# Patient Record
Sex: Female | Born: 1970
Health system: Southern US, Community
[De-identification: ages and names within clinical notes are randomized; demographics above are authoritative.]

## PROBLEM LIST (undated history)

## (undated) DIAGNOSIS — D649 Anemia, unspecified: Secondary | ICD-10-CM

## (undated) DIAGNOSIS — I890 Lymphedema, not elsewhere classified: Secondary | ICD-10-CM

## (undated) DIAGNOSIS — N200 Calculus of kidney: Secondary | ICD-10-CM

## (undated) DIAGNOSIS — N83201 Unspecified ovarian cyst, right side: Secondary | ICD-10-CM

## (undated) DIAGNOSIS — G43909 Migraine, unspecified, not intractable, without status migrainosus: Secondary | ICD-10-CM

## (undated) DIAGNOSIS — N83202 Unspecified ovarian cyst, left side: Secondary | ICD-10-CM

## (undated) HISTORY — PX: OTHER SURGICAL HISTORY: SHX169

## (undated) HISTORY — PX: TUBAL LIGATION: SHX77

---

## 1997-02-08 ENCOUNTER — Inpatient Hospital Stay (HOSPITAL_COMMUNITY): Admission: AD | Admit: 1997-02-08 | Discharge: 1997-02-08 | Payer: Self-pay | Admitting: *Deleted

## 1997-02-12 ENCOUNTER — Inpatient Hospital Stay (HOSPITAL_COMMUNITY): Admission: AD | Admit: 1997-02-12 | Discharge: 1997-02-15 | Payer: Self-pay | Admitting: *Deleted

## 1997-09-11 ENCOUNTER — Emergency Department (HOSPITAL_COMMUNITY): Admission: EM | Admit: 1997-09-11 | Discharge: 1997-09-11 | Payer: Self-pay | Admitting: Emergency Medicine

## 1997-11-15 ENCOUNTER — Ambulatory Visit (HOSPITAL_COMMUNITY): Admission: RE | Admit: 1997-11-15 | Discharge: 1997-11-15 | Payer: Self-pay | Admitting: *Deleted

## 1997-12-26 ENCOUNTER — Ambulatory Visit (HOSPITAL_COMMUNITY): Admission: RE | Admit: 1997-12-26 | Discharge: 1997-12-26 | Payer: Self-pay | Admitting: *Deleted

## 1998-01-10 ENCOUNTER — Inpatient Hospital Stay (HOSPITAL_COMMUNITY): Admission: AD | Admit: 1998-01-10 | Discharge: 1998-01-10 | Payer: Self-pay | Admitting: *Deleted

## 1998-01-29 ENCOUNTER — Ambulatory Visit (HOSPITAL_COMMUNITY): Admission: RE | Admit: 1998-01-29 | Discharge: 1998-01-29 | Payer: Self-pay | Admitting: *Deleted

## 1998-02-27 ENCOUNTER — Inpatient Hospital Stay (HOSPITAL_COMMUNITY): Admission: AD | Admit: 1998-02-27 | Discharge: 1998-02-27 | Payer: Self-pay | Admitting: *Deleted

## 1998-05-22 ENCOUNTER — Ambulatory Visit (HOSPITAL_COMMUNITY): Admission: RE | Admit: 1998-05-22 | Discharge: 1998-05-22 | Payer: Self-pay | Admitting: *Deleted

## 1998-06-16 ENCOUNTER — Emergency Department (HOSPITAL_COMMUNITY): Admission: EM | Admit: 1998-06-16 | Discharge: 1998-06-16 | Payer: Self-pay | Admitting: Emergency Medicine

## 1998-09-29 ENCOUNTER — Emergency Department (HOSPITAL_COMMUNITY): Admission: EM | Admit: 1998-09-29 | Discharge: 1998-09-29 | Payer: Self-pay | Admitting: Emergency Medicine

## 1998-10-01 ENCOUNTER — Emergency Department (HOSPITAL_COMMUNITY): Admission: EM | Admit: 1998-10-01 | Discharge: 1998-10-01 | Payer: Self-pay | Admitting: Emergency Medicine

## 1998-10-01 ENCOUNTER — Encounter: Payer: Self-pay | Admitting: Emergency Medicine

## 1999-01-13 ENCOUNTER — Emergency Department (HOSPITAL_COMMUNITY): Admission: EM | Admit: 1999-01-13 | Discharge: 1999-01-13 | Payer: Self-pay | Admitting: Emergency Medicine

## 1999-06-12 ENCOUNTER — Emergency Department (HOSPITAL_COMMUNITY): Admission: EM | Admit: 1999-06-12 | Discharge: 1999-06-12 | Payer: Self-pay | Admitting: Emergency Medicine

## 1999-08-08 ENCOUNTER — Emergency Department (HOSPITAL_COMMUNITY): Admission: EM | Admit: 1999-08-08 | Discharge: 1999-08-08 | Payer: Self-pay | Admitting: Emergency Medicine

## 2000-06-27 ENCOUNTER — Encounter: Admission: RE | Admit: 2000-06-27 | Discharge: 2000-06-27 | Payer: Self-pay | Admitting: Family Medicine

## 2000-06-27 ENCOUNTER — Encounter: Payer: Self-pay | Admitting: Family Medicine

## 2000-08-15 ENCOUNTER — Emergency Department (HOSPITAL_COMMUNITY): Admission: EM | Admit: 2000-08-15 | Discharge: 2000-08-16 | Payer: Self-pay | Admitting: Emergency Medicine

## 2001-02-09 ENCOUNTER — Emergency Department (HOSPITAL_COMMUNITY): Admission: EM | Admit: 2001-02-09 | Discharge: 2001-02-09 | Payer: Self-pay | Admitting: Emergency Medicine

## 2001-02-11 ENCOUNTER — Encounter: Payer: Self-pay | Admitting: Emergency Medicine

## 2001-02-11 ENCOUNTER — Emergency Department (HOSPITAL_COMMUNITY): Admission: EM | Admit: 2001-02-11 | Discharge: 2001-02-11 | Payer: Self-pay

## 2003-03-13 ENCOUNTER — Observation Stay (HOSPITAL_COMMUNITY): Admission: EM | Admit: 2003-03-13 | Discharge: 2003-03-14 | Payer: Self-pay | Admitting: Emergency Medicine

## 2004-10-13 ENCOUNTER — Inpatient Hospital Stay (HOSPITAL_COMMUNITY): Admission: AD | Admit: 2004-10-13 | Discharge: 2004-10-13 | Payer: Self-pay | Admitting: *Deleted

## 2004-10-19 ENCOUNTER — Other Ambulatory Visit: Admission: RE | Admit: 2004-10-19 | Discharge: 2004-10-19 | Payer: Self-pay | Admitting: Obstetrics and Gynecology

## 2004-10-23 ENCOUNTER — Ambulatory Visit (HOSPITAL_COMMUNITY): Admission: RE | Admit: 2004-10-23 | Discharge: 2004-10-23 | Payer: Self-pay | Admitting: Obstetrics and Gynecology

## 2004-11-04 ENCOUNTER — Ambulatory Visit (HOSPITAL_COMMUNITY): Admission: RE | Admit: 2004-11-04 | Discharge: 2004-11-04 | Payer: Self-pay | Admitting: Obstetrics and Gynecology

## 2004-11-04 ENCOUNTER — Encounter (INDEPENDENT_AMBULATORY_CARE_PROVIDER_SITE_OTHER): Payer: Self-pay | Admitting: Specialist

## 2006-08-02 ENCOUNTER — Emergency Department (HOSPITAL_COMMUNITY): Admission: EM | Admit: 2006-08-02 | Discharge: 2006-08-02 | Payer: Self-pay | Admitting: Emergency Medicine

## 2007-09-13 ENCOUNTER — Emergency Department (HOSPITAL_COMMUNITY): Admission: EM | Admit: 2007-09-13 | Discharge: 2007-09-13 | Payer: Self-pay | Admitting: Emergency Medicine

## 2007-11-14 ENCOUNTER — Emergency Department (HOSPITAL_COMMUNITY): Admission: EM | Admit: 2007-11-14 | Discharge: 2007-11-14 | Payer: Self-pay | Admitting: Emergency Medicine

## 2007-11-17 ENCOUNTER — Emergency Department (HOSPITAL_COMMUNITY): Admission: EM | Admit: 2007-11-17 | Discharge: 2007-11-17 | Payer: Self-pay | Admitting: Emergency Medicine

## 2007-11-27 ENCOUNTER — Ambulatory Visit: Payer: Self-pay | Admitting: Internal Medicine

## 2007-12-14 ENCOUNTER — Emergency Department (HOSPITAL_COMMUNITY): Admission: EM | Admit: 2007-12-14 | Discharge: 2007-12-14 | Payer: Self-pay | Admitting: Emergency Medicine

## 2008-01-03 ENCOUNTER — Emergency Department (HOSPITAL_COMMUNITY): Admission: EM | Admit: 2008-01-03 | Discharge: 2008-01-03 | Payer: Self-pay | Admitting: Family Medicine

## 2008-01-10 ENCOUNTER — Emergency Department (HOSPITAL_COMMUNITY): Admission: EM | Admit: 2008-01-10 | Discharge: 2008-01-10 | Payer: Self-pay | Admitting: Emergency Medicine

## 2008-02-05 ENCOUNTER — Emergency Department (HOSPITAL_COMMUNITY): Admission: EM | Admit: 2008-02-05 | Discharge: 2008-02-05 | Payer: Self-pay | Admitting: Emergency Medicine

## 2008-03-21 ENCOUNTER — Emergency Department (HOSPITAL_COMMUNITY): Admission: EM | Admit: 2008-03-21 | Discharge: 2008-03-21 | Payer: Self-pay | Admitting: Family Medicine

## 2008-05-02 ENCOUNTER — Emergency Department (HOSPITAL_COMMUNITY): Admission: EM | Admit: 2008-05-02 | Discharge: 2008-05-02 | Payer: Self-pay | Admitting: Emergency Medicine

## 2008-05-22 ENCOUNTER — Encounter: Admission: RE | Admit: 2008-05-22 | Discharge: 2008-05-22 | Payer: Self-pay | Admitting: Specialist

## 2008-06-26 ENCOUNTER — Ambulatory Visit: Payer: Self-pay | Admitting: Oncology

## 2008-07-31 ENCOUNTER — Emergency Department (HOSPITAL_COMMUNITY): Admission: EM | Admit: 2008-07-31 | Discharge: 2008-07-31 | Payer: Self-pay | Admitting: Emergency Medicine

## 2008-08-14 ENCOUNTER — Emergency Department (HOSPITAL_COMMUNITY): Admission: EM | Admit: 2008-08-14 | Discharge: 2008-08-14 | Payer: Self-pay | Admitting: Family Medicine

## 2008-09-15 ENCOUNTER — Emergency Department (HOSPITAL_COMMUNITY): Admission: EM | Admit: 2008-09-15 | Discharge: 2008-09-15 | Payer: Self-pay | Admitting: Emergency Medicine

## 2008-11-18 ENCOUNTER — Emergency Department (HOSPITAL_COMMUNITY): Admission: EM | Admit: 2008-11-18 | Discharge: 2008-11-18 | Payer: Self-pay | Admitting: Emergency Medicine

## 2008-12-06 ENCOUNTER — Emergency Department (HOSPITAL_COMMUNITY): Admission: EM | Admit: 2008-12-06 | Discharge: 2008-12-06 | Payer: Self-pay | Admitting: Emergency Medicine

## 2009-01-09 ENCOUNTER — Emergency Department (HOSPITAL_COMMUNITY): Admission: EM | Admit: 2009-01-09 | Discharge: 2009-01-09 | Payer: Self-pay | Admitting: Emergency Medicine

## 2009-02-05 ENCOUNTER — Emergency Department (HOSPITAL_COMMUNITY): Admission: EM | Admit: 2009-02-05 | Discharge: 2009-02-05 | Payer: Self-pay | Admitting: Emergency Medicine

## 2009-04-06 ENCOUNTER — Emergency Department (HOSPITAL_COMMUNITY): Admission: EM | Admit: 2009-04-06 | Discharge: 2009-04-06 | Payer: Self-pay | Admitting: Family Medicine

## 2009-04-11 ENCOUNTER — Emergency Department (HOSPITAL_COMMUNITY): Admission: EM | Admit: 2009-04-11 | Discharge: 2009-04-11 | Payer: Self-pay | Admitting: Family Medicine

## 2009-04-26 ENCOUNTER — Emergency Department (HOSPITAL_COMMUNITY): Admission: EM | Admit: 2009-04-26 | Discharge: 2009-04-26 | Payer: Self-pay | Admitting: Family Medicine

## 2009-07-17 ENCOUNTER — Emergency Department (HOSPITAL_COMMUNITY): Admission: EM | Admit: 2009-07-17 | Discharge: 2009-07-17 | Payer: Self-pay | Admitting: Family Medicine

## 2009-09-07 ENCOUNTER — Emergency Department (HOSPITAL_COMMUNITY): Admission: EM | Admit: 2009-09-07 | Discharge: 2009-09-07 | Payer: Self-pay | Admitting: Emergency Medicine

## 2010-03-22 LAB — POCT I-STAT, CHEM 8
BUN: 8 mg/dL (ref 6–23)
Calcium, Ion: 1.16 mmol/L (ref 1.12–1.32)
Chloride: 105 mEq/L (ref 96–112)
Creatinine, Ser: 0.7 mg/dL (ref 0.4–1.2)
Glucose, Bld: 92 mg/dL (ref 70–99)
HCT: 32 % — ABNORMAL LOW (ref 36.0–46.0)
Hemoglobin: 10.9 g/dL — ABNORMAL LOW (ref 12.0–15.0)
Potassium: 3.7 mEq/L (ref 3.5–5.1)
Sodium: 139 mEq/L (ref 135–145)
TCO2: 24 mmol/L (ref 0–100)

## 2010-03-22 LAB — POCT PREGNANCY, URINE: Preg Test, Ur: NEGATIVE

## 2010-04-07 ENCOUNTER — Encounter (HOSPITAL_COMMUNITY)
Admission: RE | Admit: 2010-04-07 | Discharge: 2010-04-07 | Disposition: A | Discharge status: Home / Self Care | Payer: Managed Care, Other (non HMO) | Source: Ambulatory Visit | Attending: Obstetrics | Admitting: Obstetrics

## 2010-04-07 LAB — CBC
HCT: 37 % (ref 36.0–46.0)
Hemoglobin: 11 g/dL — ABNORMAL LOW (ref 12.0–15.0)
MCH: 22 pg — ABNORMAL LOW (ref 26.0–34.0)
MCHC: 29.7 g/dL — ABNORMAL LOW (ref 30.0–36.0)
MCV: 73.9 fL — ABNORMAL LOW (ref 78.0–100.0)
RBC: 5.01 MIL/uL (ref 3.87–5.11)
WBC: 3.3 10*3/uL — ABNORMAL LOW (ref 4.0–10.5)

## 2010-04-14 ENCOUNTER — Other Ambulatory Visit: Payer: Self-pay | Admitting: Obstetrics

## 2010-04-14 ENCOUNTER — Ambulatory Visit (HOSPITAL_COMMUNITY)
Admission: RE | Admit: 2010-04-14 | Discharge: 2010-04-14 | Disposition: A | Discharge status: Home / Self Care | Payer: Managed Care, Other (non HMO) | Source: Ambulatory Visit | Attending: Obstetrics | Admitting: Obstetrics

## 2010-04-14 DIAGNOSIS — N949 Unspecified condition associated with female genital organs and menstrual cycle: Secondary | ICD-10-CM | POA: Insufficient documentation

## 2010-04-14 DIAGNOSIS — N938 Other specified abnormal uterine and vaginal bleeding: Secondary | ICD-10-CM | POA: Insufficient documentation

## 2010-04-14 DIAGNOSIS — Z01818 Encounter for other preprocedural examination: Secondary | ICD-10-CM | POA: Insufficient documentation

## 2010-04-14 DIAGNOSIS — Z01812 Encounter for preprocedural laboratory examination: Secondary | ICD-10-CM | POA: Insufficient documentation

## 2010-04-14 LAB — PREGNANCY, URINE: Preg Test, Ur: NEGATIVE

## 2010-04-15 NOTE — Op Note (Signed)
  NAMETONIYA, ROZAR NO.:  [id number]  MEDICAL RECORD NO.:  [id number]           PATIENT TYPE:  O  LOCATION:  [facility]                          [facility]  PHYSICIAN:  Kathreen Cosier, M.D.DATE OF BIRTH:  April 03, 1970  DATE OF PROCEDURE:  04/14/2010 DATE OF DISCHARGE:                              OPERATIVE REPORT   PREOPERATIVE DIAGNOSIS:  Dysfunctional uterine bleeding.  POSTOPERATIVE DIAGNOSIS:  Dysfunctional uterine bleeding.  PROCEDURE:  Hysteroscopy dilation and curettage, and NovaSure ablation.  PROCEDURE:  Under general anesthesia, the patient in lithotomy position, perineum and vagina prepped and draped.  Bladder emptied with straight catheter.  Bimanual exam revealed uterus to be normal size.  Speculum placed in the vagina.  Cervix injected with 10 mL of 1% Xylocaine.  The endometrial cavity sounded 10 cm.  Cervix was measured at 5 cm. Endocervix curetted small amount of tissue.  Cervix dilated #25 Shawnie Pons. The hysteroscope inserted and was noted she had some polyps. Hysteroscope removed.  Sharp curettage performed.  NovaSure device inserted and the cavity width was 4.5 cm.  The cavity integrity test was passed and ablation was performed at 124 watts for 1 minute and 20 seconds post ablation.  Hysteroscopy repeated, total ablation of the cavity.  Fluid deficit was 130 mL.  The patient tolerated the procedure well, taken to recovery room in good condition.          ______________________________ Kathreen Cosier, M.D.     BAM/MEDQ  D:  04/14/2010  T:  04/14/2010  Job:  [id number]  Electronically Signed by Francoise Ceo M.D. on 04/15/2010 08:23:50 AM

## 2010-05-22 NOTE — H&P (Signed)
NAMEDEANE, [name]              ACCOUNT NO.:  [id number]   MEDICAL RECORD NO.:  [id number]          PATIENT TYPE:  AMB   LOCATION:  [facility]                           [facility]   PHYSICIAN:  James A. Ashley Royalty, M.D.DATE OF BIRTH:  01/06/1970   DATE OF ADMISSION:  DATE OF DISCHARGE:                                HISTORY & PHYSICAL   This is a 40 year old gravida 2 para 2.   CHIEF COMPLAINT:  Left-sided abdominal pain and abnormal bleeding.   The patient presented to me October 19, 2004, complaining of left lower  quadrant discomfort of some 2 months duration. She also complained of excess  vaginal bleeding on a daily basis for 2 months. The amount appeared to be  something in between spotting and a normal period. She stated the pain is  rather debilitating for her. She went to [facility] October 12, 2004.  An ultrasound was performed which was normal by her history. She had had  no pelvic exam or Pap smear in some 6 years. CBC was obtained and the  hemoglobin was noted to be 7.5 with diminished indices. In addition, the  serum iron was less than 10. At the time of that visit, a polyp was noted  extending from the patient's cervix. Its origin could not be readily  determined. In addition, the uterus was noted to be upper limits of normal  size with a left fundal protuberance. The patient was also noted to have a  mass at the left aspect of her C-section incision. It was approximately 2 cm  in diameter. It was not more prominent with standing and Valsalva. I called  [facility] radiology and talked with Dr. Kearney Hard regarding the optimal  imaging of this area. She suggested that we do an abdominal and pelvic CT  scan in order to rule out the possibility of an incisional hernia. The  patient went on to have the CT scan which was initially read out by Dr.  Eppie Gibson. The impression was normal appearance of both uterus and ovaries.  There was no evidence of any pelvic mass. There  was no mention of any  hernia. However, under clinical data there was no mention of the mass in the  left aspect of the patient's C-section incision as I had told Dr. Kearney Hard.  Hence, I called radiology and talked to Dr. Molli Posey. He examined the films  carefully and told me that he felt the mass in the left aspect of her C-  section incision represented scar tissue only and was not of any other  concern.   As this patient is currently significantly anemic and has an ongoing  bleeding diathesis which is felt secondary to the structural abnormality  within her uterus, she is for [facility] hysteroscopy in order to stem the  structural abnormality and bleeding diathesis in preparation for a possible  subsequent laparoscopy in a different operative setting.   MEDICATIONS:  Naprosyn.   PAST MEDICAL HISTORY:  Medical:  C-sections x2, BTSP.   ALLERGIES:  None.   FAMILY HISTORY:  Positive for hypertension and diabetes.  SOCIAL HISTORY:  The patient denies use of tobacco or significant alcohol.   REVIEW OF SYSTEMS:  Noncontributory. The patient does admit to having a  blood transfusion in 2004.   PHYSICAL EXAMINATION:  GENERAL:  Well-developed, well-nourished, pleasant  black female in no acute distress.  VITAL SIGNS:  Afebrile, vital signs stable.  SKIN:  Warm and dry without lesions.  CHEST:  Lungs are clear.  CARDIAC:  Regular rate and rhythm without murmurs, gallops, or rubs.  BREAST:  Deferred.  ABDOMEN:  Soft and nontender. There is the 2 cm protuberance in the left  aspect of her Pfannenstiel incision which is mentioned above. Bowel sounds  are active.  MUSCULOSKELETAL:  No CVA tenderness.  PELVIC:  External genitalia within normal limits. Vagina is without gross  lesions. The cervical area reveals a polyp as previously mentioned. The  uterus is upper limits of normal size with a left fundal protuberance.   IMPRESSION:  1.  Cervical versus endometrial polyp.  2.  Abnormal uterine  bleeding - probably secondary to #1.  3.  Anemia - probably secondary to #2.  4.  History of noncompliance with gynecological care.  5.  Status post cesarean section x2.  6.  Status post bilateral tubal sterilization procedure.  7.  Left lower quadrant superficial mass near left aspect of Pfannenstiel      incision - felt to represent scar tissue per radiology.  8.  Left lower quadrant discomfort - etiology uncertain. Differential      includes endometriosis, adhesions, primary adnexal pathology, GI,      musculoskeletal.   PLAN:  Diagnostic/operative hysteroscopy with dilatation and curettage.  Risks, benefits, complications, and alternatives fully discussed with the  patient. The possible need for blood transfusion discussed and accepted.  Risks and benefits of same discussed. The patient states she understands and  accepts and is comfortable with this plan.      James A. Ashley Royalty, M.D.  Electronically Signed     JAM/MEDQ  D:  11/04/2004  T:  11/04/2004  Job:  [id number]

## 2010-05-22 NOTE — H&P (Signed)
NAME:  [name], [name]                        ACCOUNT NO.:  [id number]   MEDICAL RECORD NO.:  [id number]                   PATIENT TYPE:  EMS   LOCATION:  [facility]                                 FACILITY:  [facility]   PHYSICIAN:  Hollice Espy, M.D.            DATE OF BIRTH:  Feb 15, 1970   DATE OF ADMISSION:  03/13/2003  DATE OF DISCHARGE:                                HISTORY & PHYSICAL   PRIMARY CARE DOCTOR:  Dr. Consuella Lose C. Griffin.   OBSTETRICIAN/GYNECOLOGIST:  Dr. Kathreen Cosier.   CHIEF COMPLAINT:  Weakness and URI symptoms.   HISTORY OF PRESENT ILLNESS:  This is a 40 year old African American female  with past medical history of iron deficiency anemia which is secondary to  heavy menses, who has had, for the last 3 days, complaints of URI symptoms  including nonproductive cough, congestion, fatigue, general body aches and  some mild shortness of breath.  The patient states that for the last couple  of days she has ultimately become so weak that she can barely move.  She  states that a few days ago she had a GYN procedure to remove fibroids out of  her uterus.  Postop, she was not on any antibiotics.  She continued to feel  weak and she came into the ER today.  She was found to have a temperature of  100.6; in addition, her blood pressure was slightly decreased at 84/43, but  she was not tachycardic.  Most significant was that her hemoglobin and  hematocrit were decreased at 6.6 and 23.1.  The patient reports no history  of vomiting blood or blood in her stools.  She does note that she has had  some dark stools but this is not new; she has been having dark stools ever  since she has been on iron and these did not change in nature.  She denies  any abdominal pain or chest pain.  She only complained again of some  weakness and URI symptoms.  The patient, however, denies any visual changes  or dysphagia.  She denies any nausea or vomiting.  She denies any wheezing.  She denies  any chest pain or palpitations.  She denies any constipation,  diarrhea, hematuria or dysuria.   PAST MEDICAL HISTORY:  1. Past medical history is for hospitalization for renal infection.  2. She has also a history of kidney stones.  3. She has a history of heavy menses.  4. History of iron deficiency anemia.  5. She also has a history of uterine fibroids, status post a removal 2 days     ago.   MEDICATIONS:  The patient is on iron 325 mg p.o. daily, that is all.   ALLERGIES:  She is allergic to MORPHINE.   SOCIAL HISTORY:  She denies any tobacco, drugs or alcohol use.   FAMILY HISTORY:  Her mother also has iron deficiency anemia.   PHYSICAL EXAMINATION:  VITALS:  On admission, temperature 100.6, blood  pressure 84/43, pulse 88, respirations 20; currently, her blood pressure is  104/59, pulse 87, O2 saturation is 98% on 2 L.  GENERAL:  In general, she is in no apparent distress, alert and oriented x3,  although she does appear fatigued.  HEENT/NECK:  Normocephalic, atraumatic.  She has no carotid bruits.  Mucous  membranes are dry.  HEART:  Regular rate and rhythm.  She has what sounds to be a 2/6 innocent  flow murmur.  LUNGS:  Lungs are clear to auscultation bilaterally.  ABDOMEN:  Abdomen is soft, nontender and nondistended.  Positive bowel  sounds.  EXTREMITIES:  No clubbing, cyanosis, or edema.  She has 1+ pulses.   LABORATORY AND ACCESSORY CLINICAL DATA:  Chest x-ray shows evidence of  bronchitis.   Sodium 138, potassium 3.4, chloride 102, bicarb 22, BUN 7, creatinine 0.8,  glucose 86.  White count is 3.4, hemoglobin and hematocrit 6.6 and 23.1 with  an MCV of 54, platelet count of 194,000.  The patient has a lymphocytic  predominance of 59%.  She is also noted to have schistocytes present as well  as tear drop cells on a peripheral smear.   ASSESSMENT AND PLAN:  This is a 40 year old [place] American female who  presents with a significant anemia.  She has a history  of iron deficiency  anemia, likely secondary to heavy menses, although usually not this low.  I  feel that her previous history plus the recent uterine fibroid removal 2  days ago led to her current state.  We will transfuse 2 units, check a  hemoglobin and hematocrit 1 hour after and again tomorrow morning.  If there  is a significant drop, we will consider an obstetrical/gynecological consult  to rule out any type of vaginal bleeding.  The patient tells me that her  last menstrual period ended a week ago and she has not had any significant  vaginal bleeding since.  Although, I would also like to note that the  patient has a family history of iron deficiency anemia and noted  schistocytes and tear drop cells on her peripheral smear.  This may be more  than secondary to heavy menses; perhaps sickle cell should be evaluated in  this patient, but will defer this to her primary care doctor, unless her  hemoglobin and hematocrit continue to drop.  1. Bronchitis:  The patient gives a upper respiratory infection history with     symptoms x3 days.  Her chest x-ray is consistent with bronchitis.  She     has a slight increase in temperature but no significant increase in her     white count or shift.  I have no reason to suspect that this is a     gynecological-related infection postoperatively.  For now, we will just     treat with albuterol.  Her respiratory symptoms should improve with     blood.                                                Hollice Espy, M.D.    SKK/MEDQ  D:  03/13/2003  T:  03/14/2003  Job:  [id number]   cc:   Gretta Arab. Valentina Lucks, M.D.  [address] [place]  [place] [address]  Fax: [phone]

## 2010-05-22 NOTE — Op Note (Signed)
[name], [name]              ACCOUNT NO.:  [id number]   MEDICAL RECORD NO.:  [id number]          PATIENT TYPE:  AMB   LOCATION:  [facility]                           [facility]   PHYSICIAN:  James A. Ashley Royalty, M.D.DATE OF BIRTH:  February 16, 1970   DATE OF PROCEDURE:  11/04/2004  DATE OF DISCHARGE:                                 OPERATIVE REPORT   PREOPERATIVE DIAGNOSES:  1.  Endometrial versus endocervical polyp  2.  Abnormal uterine bleeding, problem secondary to #1.  3.  Anemia, probably secondary to #2.   POSTOPERATIVE DIAGNOSIS:  Endometrial polyps (two).   PROCEDURES:  1.  Diagnostic/operative hysteroscopy.  2.  Dilatation and curettage.   SURGEON:  Rudy Jew. Ashley Royalty, M.D.   ANESTHESIA:  General.   ESTIMATED BLOOD LOSS:  25 mL.   COMPLICATIONS:  None.   PACKS AND DRAINS:  None.   PROCEDURE:  The patient was taken to operating room and placed in the dorsal  supine position.  After general anesthesia was administered, she was placed  in the lithotomy position and prepped and draped in the usual manner for  vaginal surgery.  A posterior weighted retractor was placed per vagina and  the anterior lip of the cervix was grasped with a single-tooth tenacula.  The uterus was gently sounded to 8 cm and noted to be anteverted.  The  cervix was already dilated, and the polyp could be seen emanating from the  cervical os.  The dilatation was verified by greater than or equal to 29-  [place] using [employer] dilators.  The resectoscope was then placed into the uterine cavity using sorbitol as a  distension medium.  The entire endometrial cavity was then inspected.  The  endometrium appeared rather atrophic and the tubal ostia were easily  visualized and appropriately photographed.  The anterior and posterior  aspects of the uterine cavity were unremarkable.  As the hysteroscope was  withdrawn, the origin of the large cervical polyp was noted to be just at  the superior margin of the  cervix.  Using the resectoscope at 60 watts  power, cutting waveform, the polyp was removed.  As the hysteroscope was  further withdrawn, a smaller posterior cervical polyp was noted emanating  from the midportion of the cervix.  This was similarly excised and submitted  separately to pathology for histologic studies.  Hemostasis was obtained  with the coagulation waveform at 50 watts power.  Appropriate photos were  obtained before and after the procedure.  Hemostasis was noted.   Attention was then turned to the uterine curettage.  A medium-size curette  was introduced into the uterine cavity.  First a four-quadrant technique was  employed.  Then a therapeutic technique was employed.  All curettings were  submitted separately to pathology for histologic studies.  After the  therapeutic portion of procedure was performed, the curettage was  terminated.  Hemostasis was noted.  The tenaculum was removed and hemostasis was again noted.  The vaginal  instruments were removed and the procedure terminated.   The patient tolerated the procedure extremely well and was returned to the  recovery room in good condition.      James A. Ashley Royalty, M.D.  Electronically Signed     JAM/MEDQ  D:  11/04/2004  T:  11/04/2004  Job:  [id number]

## 2010-05-22 NOTE — Discharge Summary (Signed)
NAME:  FIORELA, PELZER                        ACCOUNT NO.:  [id number]   MEDICAL RECORD NO.:  [id number]                   PATIENT TYPE:  INP   LOCATION:  3741                                 FACILITY:  [facility]   PHYSICIAN:  Melissa L. Ladona Ridgel, MD               DATE OF BIRTH:  November 05, 1970   DATE OF ADMISSION:  03/13/2003  DATE OF DISCHARGE:  03/14/2003                                 DISCHARGE SUMMARY   ADMITTING DIAGNOSES:  1. Weakness.  2. Upper respiratory infection symptoms.   DISCHARGE DIAGNOSES:  1. Anemia.  2. Upper respiratory infection symptoms.   DISCHARGE MEDICATIONS:  1. Augmentin 500/125 mg p.o. b.i.d. x 7 days.  2. Symptomatic treatment with over the counter medications for cough and     congestion.  Recommendations are Guaifenesin and a decongestant.   PAIN MANAGEMENT:  With Tylenol as directed.   SPECIAL INSTRUCTIONS:  Drink as much as possible to keep yourself well  hydrated.   FOLLOW UP:  1. Followup with your OB/GYN, Dr. Kathreen Cosier, as scheduled.  2. Followup with primary care physician for further or worsening cold     symptoms.   HISTORY OF PRESENT ILLNESS:  The patient is a 40 year old African American  female presents with an iron deficiency anemia likely secondary to heavy  menses and recent fibroid tumor removal.  Over the past several days, she  had a temperature of 100.6, and was found to have a slightly decreased blood  pressure in the emergency room.  Her hemoglobin and hematocrit were found to  be 6.6 and 23.1 respectively, consistent with her recent surgery and  possible heavy menses.  The patient was admitted to the telemetry floor.  She was transfused with 2 units of packed red blood cells with good results,  bringing her hemoglobin and hematocrit up to 9 and 29.9 directly after and  8.6. and 28.3 at discharge.  She felt understandably weak on the morning of  discharge but definitely improved in her symptomatology.  Her blood  pressure  was stable during the course of the hospitalization.  On the morning of  discharge her temperature was 98.2 with a blood pressure of 102/56, pulse of  54,of and respirations 20.  She was sating 97% on room air.  She remained  with some congestion and cough but generally appeared well.   PHYSICAL EXAMINATION:  GENERAL:  Her physical exam was consistent with being  in generally in acute distress.  HEENT:  Pupils equal, round and reactive to light.  Extraocular muscles were  intact.  Mucous membranes were moist.  NECK:  Supple.  There was no JVD.  No lymph nodes.  CHEST:  Scattered rhonchi that cleared with cough.  ABDOMEN:  Soft, nontender, nondistended with positive bowel sounds.  EXTREMITIES:  There was no edema on her extremities.   Her URI symptoms remained pretty much at baseline with symptomatic treatment  during  the hospital course.  She was deemed stable for discharge for further  outpatient care of her upper respiratory infection.  Because of the  increased amount of nasal drainage and tenderness over her sinuses, she was  started on Augmentin 500/125 p.o. b.i.d. x 7 days.  Otherwise instructed to  followup with her primary care physician for further prolonged or worsening  symptoms.  She was found to be stable for discharge to followup with her  OB/GYN regarding her vaginal bleeding and anemia.  She expressed  understanding of the instructions and was desirous of returning home to  recuperate from her upper respiratory infection.   Please note:  That the influenza A and B antigen were tested on admission,  both were negative.                                                Melissa L. Ladona Ridgel, MD    MLT/MEDQ  D:  03/15/2003  T:  03/17/2003  Job:  [id number]   cc:   Gretta Arab. Valentina Lucks, M.D.  [address] [place]  [place] [address]  Fax: [phone]   Kathreen Cosier, M.D.  [address]  [place]  [place] [address]  Fax: [phone]

## 2010-06-01 ENCOUNTER — Emergency Department (HOSPITAL_COMMUNITY)
Admission: EM | Admit: 2010-06-01 | Discharge: 2010-06-01 | Disposition: A | Discharge status: Home / Self Care | Payer: Managed Care, Other (non HMO) | Attending: Emergency Medicine | Admitting: Emergency Medicine

## 2010-06-01 DIAGNOSIS — R509 Fever, unspecified: Secondary | ICD-10-CM | POA: Insufficient documentation

## 2010-06-01 DIAGNOSIS — J329 Chronic sinusitis, unspecified: Secondary | ICD-10-CM | POA: Insufficient documentation

## 2010-06-01 DIAGNOSIS — R112 Nausea with vomiting, unspecified: Secondary | ICD-10-CM | POA: Insufficient documentation

## 2010-06-01 DIAGNOSIS — F29 Unspecified psychosis not due to a substance or known physiological condition: Secondary | ICD-10-CM | POA: Insufficient documentation

## 2010-06-01 DIAGNOSIS — R51 Headache: Secondary | ICD-10-CM | POA: Insufficient documentation

## 2010-06-03 ENCOUNTER — Inpatient Hospital Stay (INDEPENDENT_AMBULATORY_CARE_PROVIDER_SITE_OTHER)
Admission: RE | Admit: 2010-06-03 | Discharge: 2010-06-03 | Disposition: A | Discharge status: Home / Self Care | Payer: Managed Care, Other (non HMO) | Source: Ambulatory Visit | Attending: Family Medicine | Admitting: Family Medicine

## 2010-06-03 DIAGNOSIS — L0201 Cutaneous abscess of face: Secondary | ICD-10-CM

## 2010-06-03 DIAGNOSIS — J019 Acute sinusitis, unspecified: Secondary | ICD-10-CM

## 2010-09-22 ENCOUNTER — Inpatient Hospital Stay (INDEPENDENT_AMBULATORY_CARE_PROVIDER_SITE_OTHER)
Admission: RE | Admit: 2010-09-22 | Discharge: 2010-09-22 | Disposition: A | Discharge status: Home / Self Care | Payer: Managed Care, Other (non HMO) | Source: Ambulatory Visit | Attending: Family Medicine | Admitting: Family Medicine

## 2010-09-22 DIAGNOSIS — L03211 Cellulitis of face: Secondary | ICD-10-CM

## 2010-10-06 LAB — CBC
HCT: 27.6 — ABNORMAL LOW
Hemoglobin: 8 — ABNORMAL LOW
MCHC: 28.9 — ABNORMAL LOW
MCV: 56.8 — ABNORMAL LOW
Platelets: 370
RBC: 4.86
RDW: 22.3 — ABNORMAL HIGH
WBC: 2.5 — ABNORMAL LOW

## 2010-10-06 LAB — URINE MICROSCOPIC-ADD ON

## 2010-10-06 LAB — GC/CHLAMYDIA PROBE AMP, GENITAL
Chlamydia, DNA Probe: NEGATIVE
GC Probe Amp, Genital: NEGATIVE

## 2010-10-06 LAB — POCT I-STAT, CHEM 8
BUN: <3 — ABNORMAL LOW
Calcium, Ion: 1.23
Chloride: 106
Creatinine, Ser: 0.7
Glucose, Bld: 98
HCT: 30 — ABNORMAL LOW
Hemoglobin: 10.2 — ABNORMAL LOW
Potassium: 3.4 — ABNORMAL LOW
Sodium: 142
TCO2: 24

## 2010-10-06 LAB — DIFFERENTIAL
Basophils Relative: 3 — ABNORMAL HIGH
Eosinophils Relative: 1
Lymphocytes Relative: 23
Monocytes Relative: 22 — ABNORMAL HIGH
Neutrophils Relative %: 51

## 2010-10-06 LAB — URINALYSIS, ROUTINE W REFLEX MICROSCOPIC
Bilirubin Urine: NEGATIVE
Glucose, UA: NEGATIVE
Hgb urine dipstick: NEGATIVE
Leukocytes, UA: NEGATIVE
Nitrite: NEGATIVE
Protein, ur: 100 — AB
Specific Gravity, Urine: 1.03
Urobilinogen, UA: 1
pH: 6.5

## 2010-10-06 LAB — WET PREP, GENITAL
Trich, Wet Prep: NONE SEEN
Yeast Wet Prep HPF POC: NONE SEEN

## 2010-10-06 LAB — POCT PREGNANCY, URINE: Preg Test, Ur: NEGATIVE

## 2010-10-06 LAB — RPR: RPR Ser Ql: NONREACTIVE

## 2010-10-09 ENCOUNTER — Inpatient Hospital Stay (INDEPENDENT_AMBULATORY_CARE_PROVIDER_SITE_OTHER)
Admission: RE | Admit: 2010-10-09 | Discharge: 2010-10-09 | Disposition: A | Discharge status: Home / Self Care | Payer: Managed Care, Other (non HMO) | Source: Ambulatory Visit | Attending: Family Medicine | Admitting: Family Medicine

## 2010-10-09 DIAGNOSIS — J019 Acute sinusitis, unspecified: Secondary | ICD-10-CM

## 2010-10-09 DIAGNOSIS — J310 Chronic rhinitis: Secondary | ICD-10-CM

## 2010-10-19 LAB — URINALYSIS, ROUTINE W REFLEX MICROSCOPIC
Bilirubin Urine: NEGATIVE
Glucose, UA: NEGATIVE
Hgb urine dipstick: NEGATIVE
Ketones, ur: NEGATIVE
Specific Gravity, Urine: 1.023
pH: 7.5

## 2010-10-19 LAB — PREGNANCY, URINE: Preg Test, Ur: NEGATIVE

## 2010-11-14 ENCOUNTER — Emergency Department (HOSPITAL_COMMUNITY)
Admission: EM | Admit: 2010-11-14 | Discharge: 2010-11-14 | Disposition: A | Discharge status: Home / Self Care | Payer: Managed Care, Other (non HMO) | Attending: Emergency Medicine | Admitting: Emergency Medicine

## 2010-11-14 ENCOUNTER — Encounter: Payer: Self-pay | Admitting: *Deleted

## 2010-11-14 DIAGNOSIS — G43909 Migraine, unspecified, not intractable, without status migrainosus: Secondary | ICD-10-CM | POA: Insufficient documentation

## 2010-11-14 NOTE — ED Provider Notes (Signed)
History     CSN: [id number] Arrival date & time: 11/14/2010 12:44 PM   First MD Initiated Contact with Patient 11/14/10 1348      Chief Complaint  Patient presents with  . Migraine    (Consider location/radiation/quality/duration/timing/severity/associated sxs/prior treatment) HPI Comments: Reason for exam patient reports that her headache has now resolved, which is typical for her migraine headaches  Patient is a 40 y.o. female presenting with migraine. The history is provided by the patient.  Migraine This is a recurrent problem. The current episode started yesterday. The problem occurs intermittently. The problem has been resolved. Pertinent negatives include no headaches. The symptoms are aggravated by nothing. She has tried immobilization for the symptoms. The treatment provided no relief.    History reviewed. No pertinent past medical history.  Past Surgical History  Procedure Date  . Cesarean section   . Tubal ligation     No family history on file.  History  Substance Use Topics  . Smoking status: Never Smoker   . Smokeless tobacco: Not on file  . Alcohol Use: Yes    OB History    Grav Para Term Preterm Abortions TAB SAB Ect Mult Living                  Review of Systems  Constitutional: Negative.   HENT: Negative.   Eyes: Negative.   Respiratory: Negative.   Cardiovascular: Negative.   Gastrointestinal: Negative.   Genitourinary: Negative.   Musculoskeletal: Negative.   Neurological: Negative.  Negative for dizziness and headaches.  Hematological: Negative.   Psychiatric/Behavioral: Negative.     Allergies  Review of patient's allergies indicates no known allergies.  Home Medications  No current outpatient prescriptions on file.  BP 121/79  Pulse 70  Temp(Src) 98.2 F (36.8 C) (Oral)  Resp 20  SpO2 100%  Physical Exam  Constitutional: She appears well-nourished.  HENT:  Head: Normocephalic.  Eyes: EOM are normal.  Neck: Normal  range of motion.  Pulmonary/Chest: Breath sounds normal.  Skin: Skin is warm and dry.  Psychiatric: She has a normal mood and affect.    ED Course  Procedures (including critical care time)  Labs Reviewed - No data to display No results found.   1. Migraine       MDM  Migraine headache both migraine headache, resolved on exam, will discharge and allow patient to followup with PCP        Arman Filter, NP 11/14/10 1413  Arman Filter, NP 11/14/10 1421

## 2010-11-14 NOTE — ED Notes (Signed)
Patient is alert and oriented x3.  She has been given DC paperwork and MD return visit instructions Patient gave verbal understanding.   V/S stable.  She is not showing any signs of distress at this time

## 2010-11-14 NOTE — ED Notes (Signed)
Pt has history of migraines, headache started 2 days ago, vomiting  And photophobia

## 2010-11-15 NOTE — ED Provider Notes (Signed)
Medical screening examination/treatment/procedure(s) were conducted as a shared visit with non-physician practitioner(s) and myself.  I personally evaluated the patient during the encounter  Toy Baker, MD 11/15/10 [phone]

## 2011-01-12 ENCOUNTER — Encounter (HOSPITAL_BASED_OUTPATIENT_CLINIC_OR_DEPARTMENT_OTHER): Payer: Self-pay | Admitting: *Deleted

## 2011-01-12 ENCOUNTER — Emergency Department (HOSPITAL_BASED_OUTPATIENT_CLINIC_OR_DEPARTMENT_OTHER)
Admission: EM | Admit: 2011-01-12 | Discharge: 2011-01-12 | Disposition: A | Discharge status: Home / Self Care | Payer: Managed Care, Other (non HMO) | Attending: Emergency Medicine | Admitting: Emergency Medicine

## 2011-01-12 DIAGNOSIS — J019 Acute sinusitis, unspecified: Secondary | ICD-10-CM | POA: Insufficient documentation

## 2011-01-12 DIAGNOSIS — R51 Headache: Secondary | ICD-10-CM | POA: Insufficient documentation

## 2011-01-12 MED ORDER — AMOXICILLIN 500 MG PO CAPS: 500.0000 mg | ORAL_CAPSULE | Freq: Three times a day (TID) | ORAL | Status: AC

## 2011-01-12 MED ORDER — TRAMADOL HCL 50 MG PO TABS: 50.0000 mg | ORAL_TABLET | Freq: Four times a day (QID) | ORAL | Status: AC | PRN

## 2011-01-12 NOTE — ED Notes (Signed)
Pt amb to triage with quick steady gait in nad. Pt reports 3 days of intermittant headaches that feel similar to her past sinus infection headaches.

## 2011-01-12 NOTE — ED Provider Notes (Signed)
History     CSN: [id number]  Arrival date & time 01/12/11  0907   First MD Initiated Contact with Patient 01/12/11 1002      Chief Complaint  Patient presents with  . Sinusitis    (Consider location/radiation/quality/duration/timing/severity/associated sxs/prior treatment) HPI Comments: Pt has had sinus trouble with frontal headache for a week.  She has had fever to 101.  The pain is mainly over the left maxillary sinus.  She rates the pain at a 7.  There is some rhinorrhea.  Patient is a 41 y.o. female presenting with sinusitis.  Sinusitis  The current episode started more than 1 week ago. The problem has been gradually worsening. The maximum temperature recorded prior to her arrival was 101 to 101.9 F. The fever has been present for 1 to 2 days. The pain is at a severity of 7/10. The pain has been intermittent since onset. Associated symptoms include congestion and sinus pressure. She has tried nothing for the symptoms.    History reviewed. No pertinent past medical history.  Past Surgical History  Procedure Date  . Cesarean section   . Tubal ligation     No family history on file.  History  Substance Use Topics  . Smoking status: Never Smoker   . Smokeless tobacco: Not on file  . Alcohol Use: Yes    OB History    Grav Para Term Preterm Abortions TAB SAB Ect Mult Living                  Review of Systems  Constitutional: Positive for fever.  HENT: Positive for congestion, rhinorrhea, postnasal drip and sinus pressure.        Facial pain.  Eyes: Negative.   Respiratory: Negative.   Cardiovascular: Negative.   Gastrointestinal: Negative.   Genitourinary: Negative.   Musculoskeletal: Negative.   Skin: Negative.   Neurological: Negative.   Psychiatric/Behavioral: Negative.     Allergies  Review of patient's allergies indicates no known allergies.  Home Medications   Current Outpatient Rx  Name Route Sig Dispense Refill  . FERROUS SULFATE 325 (65 FE) MG  PO TABS Oral Take 325 mg by mouth daily with breakfast.      . AMOXICILLIN 500 MG PO CAPS Oral Take 1 capsule (500 mg total) by mouth 3 (three) times daily. 30 capsule 0  . TRAMADOL HCL 50 MG PO TABS Oral Take 1 tablet (50 mg total) by mouth every 6 (six) hours as needed for pain. 15 tablet 0    BP 110/71  Pulse 62  Temp(Src) 98.2 F (36.8 C) (Oral)  Resp 18  Ht 5\' 10"  (1.778 m)  Wt 180 lb (81.647 kg)  BMI 25.83 kg/m2  SpO2 100%  Physical Exam  Nursing note and vitals reviewed. Constitutional: She is oriented to person, place, and time. She appears well-developed and well-nourished. No distress.  HENT:  Head: Normocephalic and atraumatic.  Right Ear: External ear normal.  Left Ear: External ear normal.  Mouth/Throat: Oropharynx is clear and moist.       She has boggy nasal mucosa, swollen turbinates, and a clear rhinorrhea.  Neck: Normal range of motion. Neck supple.  Cardiovascular: Normal rate, regular rhythm and normal heart sounds.   Pulmonary/Chest: Effort normal and breath sounds normal.  Abdominal: Bowel sounds are normal.  Musculoskeletal: Normal range of motion.  Lymphadenopathy:    She has no cervical adenopathy.  Neurological: She is alert and oriented to person, place, and time.  No sensory or motor deficit.  Skin: Skin is warm and dry.  Psychiatric: She has a normal mood and affect. Her behavior is normal.    ED Course  Procedures (including critical care time)  DISP:  Rx for sinusitis with amoxicillin and for pain with Tramadol.    1. Acute sinusitis           Carleene Cooper III, MD 01/12/11 2036

## 2011-02-10 ENCOUNTER — Emergency Department (HOSPITAL_COMMUNITY)
Admission: EM | Admit: 2011-02-10 | Discharge: 2011-02-10 | Disposition: A | Discharge status: Home / Self Care | Payer: Managed Care, Other (non HMO) | Source: Home / Self Care

## 2011-04-01 ENCOUNTER — Emergency Department (HOSPITAL_BASED_OUTPATIENT_CLINIC_OR_DEPARTMENT_OTHER)
Admission: EM | Admit: 2011-04-01 | Discharge: 2011-04-01 | Disposition: A | Discharge status: Home / Self Care | Payer: Managed Care, Other (non HMO) | Attending: Emergency Medicine | Admitting: Emergency Medicine

## 2011-04-01 ENCOUNTER — Encounter (HOSPITAL_BASED_OUTPATIENT_CLINIC_OR_DEPARTMENT_OTHER): Payer: Self-pay | Admitting: *Deleted

## 2011-04-01 DIAGNOSIS — J329 Chronic sinusitis, unspecified: Secondary | ICD-10-CM | POA: Insufficient documentation

## 2011-04-01 MED ORDER — TRAMADOL HCL 50 MG PO TABS: 50.0000 mg | ORAL_TABLET | Freq: Four times a day (QID) | ORAL | Status: AC | PRN

## 2011-04-01 MED ORDER — OXYMETAZOLINE HCL 0.05 % NA SOLN
2.0000 | Freq: Once | NASAL | Status: AC
  Administered 2011-04-01: 2 via NASAL
  Filled 2011-04-01: qty 15

## 2011-04-01 MED ORDER — AZITHROMYCIN 250 MG PO TABS: 250.0000 mg | ORAL_TABLET | Freq: Every day | ORAL | Status: DC

## 2011-04-01 MED ORDER — ACETAMINOPHEN 325 MG PO TABS
650.0000 mg | ORAL_TABLET | Freq: Once | ORAL | Status: AC
  Administered 2011-04-01: 650 mg via ORAL
  Filled 2011-04-01: qty 2

## 2011-04-01 MED ORDER — AZITHROMYCIN 250 MG PO TABS
500.0000 mg | ORAL_TABLET | Freq: Once | ORAL | Status: AC
  Administered 2011-04-01: 500 mg via ORAL
  Filled 2011-04-01: qty 2

## 2011-04-01 NOTE — Discharge Instructions (Signed)

## 2011-04-01 NOTE — ED Notes (Signed)
4 day history of nasal congestion fever sinus pressure and drainage worse yesterday has taken ibuprofen 800 mg today prior to the visit

## 2011-04-01 NOTE — ED Provider Notes (Signed)
History     CSN: [id number]  Arrival date & time 04/01/11  1191   First MD Initiated Contact with Patient 04/01/11 [phone]      Chief Complaint  Patient presents with  . Fever  . Nasal Congestion  . Facial Pain    sinus pressure    (Consider location/radiation/quality/duration/timing/severity/associated sxs/prior treatment) HPI Patient is a 41 yo female who presents with 5 days of congestion, sinus pressure, and mild headache.  She denies cough, fevers, and sore throat though she did have post-nasal drip.  The patient has a son who had the same symptoms but only for 24 hours.  She has tried OTC meds without relief. She reports 6/10 pain in her maxillary sinuses bilaterally. There are no other associated or modifying factors.  History reviewed. No pertinent past medical history.  Past Surgical History  Procedure Date  . Cesarean section   . Tubal ligation   . Abdominal hysterectomy     History reviewed. No pertinent family history.  History  Substance Use Topics  . Smoking status: Never Smoker   . Smokeless tobacco: Not on file  . Alcohol Use: No    OB History    Grav Para Term Preterm Abortions TAB SAB Ect Mult Living                  Review of Systems  Constitutional: Positive for chills and fatigue. Negative for fever.  HENT: Positive for congestion, rhinorrhea, postnasal drip and sinus pressure.   Eyes: Negative.   Respiratory: Negative.   Cardiovascular: Negative.   Gastrointestinal: Negative.   Genitourinary: Negative.   Musculoskeletal: Negative.   Skin: Negative.   Neurological: Positive for headaches.  Hematological: Negative.   Psychiatric/Behavioral: Negative.   All other systems reviewed and are negative.    Allergies  Review of patient's allergies indicates no known allergies.  Home Medications   Current Outpatient Rx  Name Route Sig Dispense Refill  . AZITHROMYCIN 250 MG PO TABS Oral Take 1 tablet (250 mg total) by mouth daily. 4 each 0    . FERROUS SULFATE 325 (65 FE) MG PO TABS Oral Take 325 mg by mouth daily with breakfast.      . TRAMADOL HCL 50 MG PO TABS Oral Take 1 tablet (50 mg total) by mouth every 6 (six) hours as needed for pain. 15 tablet 0    BP 111/64  Pulse 79  Temp(Src) 98.4 F (36.9 C) (Oral)  Resp 16  SpO2 100%  Physical Exam  Nursing note and vitals reviewed. GEN: Well-developed, well-nourished female in no distress HEENT: Atraumatic, normocephalic. Oropharynx clear without edema but with symmetric erythema without exudate, nasal congestion noted bilaterally, significant TTP over the maxillary sinuses BL EYES: PERRLA BL, no scleral icterus. NECK: Trachea midline, no meningismus, mild cervical lymphadenopathy CV: regular rate and rhythm. No murmurs, rubs, or gallops PULM: No respiratory distress.  No crackles, wheezes, or rales. GI: soft, non-tender. No guarding, rebound, or tenderness. + bowel sounds  GU: deferred Neuro: cranial nerves grossly 2-12 intact, no abnormalities of strength or sensation, A and O x 3 MSK: Patient moves all 4 extremities symmetrically, no deformity, edema, or injury noted Skin: No rashes petechiae, purpura, or jaundice Psych: no abnormality of mood   ED Course  Procedures (including critical care time)  Labs Reviewed - No data to display No results found.   1. Sinusitis       MDM  Patient was evaluated by myself.  Based on findings I  treated her with ibuprofen, Afrin, and azithromycin given her significant TTP over the sinuses.  I did spend 5 minutes counseling the patient that most of the times these symptoms were self-limited even without antibiotics and frequently were viral.  Patient was discharged with remainder of antibiotic course as well as instructions to only use Afrin for 3 days.  She was given prescription for ibuprofen and tramadol for her pain as well and was discharged in good condition.        Cyndra Numbers, MD 04/02/11 [phone]

## 2011-04-26 ENCOUNTER — Ambulatory Visit (INDEPENDENT_AMBULATORY_CARE_PROVIDER_SITE_OTHER): Payer: Managed Care, Other (non HMO) | Admitting: Family Medicine

## 2011-04-26 VITALS — BP 115/73 | HR 66 | Temp 98.4°F | Resp 16 | Ht 67.25 in | Wt 193.8 lb

## 2011-04-26 DIAGNOSIS — G501 Atypical facial pain: Secondary | ICD-10-CM

## 2011-04-26 MED ORDER — CEPHALEXIN 500 MG PO CAPS: 1000.0000 mg | ORAL_CAPSULE | Freq: Two times a day (BID) | ORAL | Status: AC

## 2011-04-26 MED ORDER — TRAMADOL HCL 50 MG PO TABS: 50.0000 mg | ORAL_TABLET | Freq: Three times a day (TID) | ORAL | Status: AC | PRN

## 2011-04-26 NOTE — Progress Notes (Signed)
  Patient Name: [name] Date of Birth: Jul 22, 1970 Medical Record Number: [id number] Gender: female Date of Encounter: 04/26/2011  History of Present Illness:  [name] is a 41 y.o. very pleasant female patient who presents with the following:  Here with facial pain and swelling for the past 4 or 5 days.  Her ENT is Dr. Christain [place]- she will see him next week. She thinks that she may have an infection.  She had a polyp that was "drained" by ENT in the same location a year ago- she was told it might come back.  She has had a Temp of 102 yesterday but this is now better.  No ST, she felt flu- like when she had the fever but this is now better.  The left side of her nose feels stuffy.  No cough, no GI symptoms.   History of BTL. Generally healthy   There is no problem list on file for this patient.  No past medical history on file. Past Surgical History  Procedure Date  . Cesarean section   . Tubal ligation   . Abdominal hysterectomy    History  Substance Use Topics  . Smoking status: Never Smoker   . Smokeless tobacco: Not on file  . Alcohol Use: No   No family history on file. No Known Allergies  Medication list has been reviewed and updated.  Review of Systems: As per HPI- otherwise negative.  Physical Examination: Filed Vitals:   04/26/11 1858  BP: 115/73  Pulse: 66  Temp: 98.4 F (36.9 C)  TempSrc: Oral  Resp: 16  Height: 5' 7.25" (1.708 m)  Weight: 193 lb 12.8 oz (87.907 kg)    Body mass index is 30.13 kg/(m^2).  GEN: WDWN, NAD, Non-toxic, A & O x 3, overweight HEENT: Atraumatic, Normocephalic. Neck supple. No masses, No LAD. PEERL, TM wnl, tender left facial swelling in the naso-labial fold area.  Oropharynx wnl.   Ears and Nose: No external deformity. CV: RRR, No M/G/R. No JVD. No thrill. No extra heart sounds. PULM: CTA B, no wheezes, crackles, rhonchi. No retractions. No resp. distress. No accessory muscle use. EXTR: No c/c/e NEURO Normal gait.    PSYCH: Normally interactive. Conversant. Not depressed or anxious appearing.  Calm demeanor. .  Assessment and Plan: 1. Facial pain  cephALEXin (KEFLEX) 500 MG capsule, traMADol (ULTRAM) 50 MG tablet   Possible recurrent ?polyp.  Per patient this is very similar to her presentation in the past.  Will cover with keflex, keep appt with ENT.  Ultram for pain, hot compresses. Let me know if getting worse.

## 2011-05-06 ENCOUNTER — Ambulatory Visit (INDEPENDENT_AMBULATORY_CARE_PROVIDER_SITE_OTHER): Payer: Managed Care, Other (non HMO) | Admitting: Otolaryngology

## 2011-05-28 ENCOUNTER — Encounter (HOSPITAL_BASED_OUTPATIENT_CLINIC_OR_DEPARTMENT_OTHER): Payer: Self-pay | Admitting: *Deleted

## 2011-05-28 ENCOUNTER — Emergency Department (HOSPITAL_BASED_OUTPATIENT_CLINIC_OR_DEPARTMENT_OTHER)
Admission: EM | Admit: 2011-05-28 | Discharge: 2011-05-28 | Disposition: A | Discharge status: Home / Self Care | Payer: Managed Care, Other (non HMO) | Attending: Emergency Medicine | Admitting: Emergency Medicine

## 2011-05-28 ENCOUNTER — Emergency Department (HOSPITAL_BASED_OUTPATIENT_CLINIC_OR_DEPARTMENT_OTHER): Payer: Managed Care, Other (non HMO)

## 2011-05-28 DIAGNOSIS — X58XXXA Exposure to other specified factors, initial encounter: Secondary | ICD-10-CM | POA: Insufficient documentation

## 2011-05-28 DIAGNOSIS — Y998 Other external cause status: Secondary | ICD-10-CM | POA: Insufficient documentation

## 2011-05-28 DIAGNOSIS — S8392XA Sprain of unspecified site of left knee, initial encounter: Secondary | ICD-10-CM

## 2011-05-28 DIAGNOSIS — IMO0002 Reserved for concepts with insufficient information to code with codable children: Secondary | ICD-10-CM | POA: Insufficient documentation

## 2011-05-28 DIAGNOSIS — Y9301 Activity, walking, marching and hiking: Secondary | ICD-10-CM | POA: Insufficient documentation

## 2011-05-28 MED ORDER — TRAMADOL HCL 50 MG PO TABS: 50.0000 mg | ORAL_TABLET | Freq: Once | ORAL | Status: AC

## 2011-05-28 MED ORDER — TRAMADOL HCL 50 MG PO TABS
50.0000 mg | ORAL_TABLET | Freq: Once | ORAL | Status: AC
  Administered 2011-05-28: 50 mg via ORAL
  Filled 2011-05-28: qty 1

## 2011-05-28 NOTE — Discharge Instructions (Signed)
Knee Pain The knee is the complex joint between your thigh and your lower leg. It is made up of bones, tendons, ligaments, and cartilage. The bones that make up the knee are:  The femur in the thigh.   The tibia and fibula in the lower leg.   The patella or kneecap riding in the groove on the lower femur.  CAUSES  Knee pain is a common complaint with many causes. A few of these causes are:  Injury, such as:   A ruptured ligament or tendon injury.   Torn cartilage.   Medical conditions, such as:   Gout   Arthritis   Infections   Overuse, over training or overdoing a physical activity.  Knee pain can be minor or severe. Knee pain can accompany debilitating injury. Minor knee problems often respond well to self-care measures or get well on their own. More serious injuries may need medical intervention or even surgery. SYMPTOMS The knee is complex. Symptoms of knee problems can vary widely. Some of the problems are:  Pain with movement and weight bearing.   Swelling and tenderness.   Buckling of the knee.   Inability to straighten or extend your knee.   Your knee locks and you cannot straighten it.   Warmth and redness with pain and fever.   Deformity or dislocation of the kneecap.  DIAGNOSIS  Determining what is wrong may be very straight forward such as when there is an injury. It can also be challenging because of the complexity of the knee. Tests to make a diagnosis may include:  Your caregiver taking a history and doing a physical exam.   Routine X-rays can be used to rule out other problems. X-rays will not reveal a cartilage tear. Some injuries of the knee can be diagnosed by:   Arthroscopy a surgical technique by which a small video camera is inserted through tiny incisions on the sides of the knee. This procedure is used to examine and repair internal knee joint problems. Tiny instruments can be used during arthroscopy to repair the torn knee cartilage  (meniscus).   Arthrography is a radiology technique. A contrast liquid is directly injected into the knee joint. Internal structures of the knee joint then become visible on X-ray film.   An MRI scan is a non x-ray radiology procedure in which magnetic fields and a computer produce two- or three-dimensional images of the inside of the knee. Cartilage tears are often visible using an MRI scanner. MRI scans have largely replaced arthrography in diagnosing cartilage tears of the knee.   Blood work.   Examination of the fluid that helps to lubricate the knee joint (synovial fluid). This is done by taking a sample out using a needle and a syringe.  TREATMENT The treatment of knee problems depends on the cause. Some of these treatments are:  Depending on the injury, proper casting, splinting, surgery or physical therapy care will be needed.   Give yourself adequate recovery time. Do not overuse your joints. If you begin to get sore during workout routines, back off. Slow down or do fewer repetitions.   For repetitive activities such as cycling or running, maintain your strength and nutrition.   Alternate muscle groups. For example if you are a weight lifter, work the upper body on one day and the lower body the next.   Either tight or weak muscles do not give the proper support for your knee. Tight or weak muscles do not absorb the stress placed   on the knee joint. Keep the muscles surrounding the knee strong.   Take care of mechanical problems.   If you have flat feet, orthotics or special shoes may help. See your caregiver if you need help.   Arch supports, sometimes with wedges on the inner or outer aspect of the heel, can help. These can shift pressure away from the side of the knee most bothered by osteoarthritis.   A brace called an "unloader" brace also may be used to help ease the pressure on the most arthritic side of the knee.   If your caregiver has prescribed crutches, braces,  wraps or ice, use as directed. The acronym for this is PRICE. This means protection, rest, ice, compression and elevation.   Nonsteroidal anti-inflammatory drugs (NSAID's), can help relieve pain. But if taken immediately after an injury, they may actually increase swelling. Take NSAID's with food in your stomach. Stop them if you develop stomach problems. Do not take these if you have a history of ulcers, stomach pain or bleeding from the bowel. Do not take without your caregiver's approval if you have problems with fluid retention, heart failure, or kidney problems.   For ongoing knee problems, physical therapy may be helpful.   Glucosamine and chondroitin are over-the-counter dietary supplements. Both may help relieve the pain of osteoarthritis in the knee. These medicines are different from the usual anti-inflammatory drugs. Glucosamine may decrease the rate of cartilage destruction.   Injections of a corticosteroid drug into your knee joint may help reduce the symptoms of an arthritis flare-up. They may provide pain relief that lasts a few months. You may have to wait a few months between injections. The injections do have a small increased risk of infection, water retention and elevated blood sugar levels.   Hyaluronic acid injected into damaged joints may ease pain and provide lubrication. These injections may work by reducing inflammation. A series of shots may give relief for as long as 6 months.   Topical painkillers. Applying certain ointments to your skin may help relieve the pain and stiffness of osteoarthritis. Ask your pharmacist for suggestions. Many over the-counter products are approved for temporary relief of arthritis pain.   In some countries, doctors often prescribe topical NSAID's for relief of chronic conditions such as arthritis and tendinitis. A review of treatment with NSAID creams found that they worked as well as oral medications but without the serious side effects.    PREVENTION  Maintain a healthy weight. Extra pounds put more strain on your joints.   Get strong, stay limber. Weak muscles are a common cause of knee injuries. Stretching is important. Include flexibility exercises in your workouts.   Be smart about exercise. If you have osteoarthritis, chronic knee pain or recurring injuries, you may need to change the way you exercise. This does not mean you have to stop being active. If your knees ache after jogging or playing basketball, consider switching to swimming, water aerobics or other low-impact activities, at least for a few days a week. Sometimes limiting high-impact activities will provide relief.   Make sure your shoes fit well. Choose footwear that is right for your sport.   Protect your knees. Use the proper gear for knee-sensitive activities. Use kneepads when playing volleyball or laying carpet. Buckle your seat belt every time you drive. Most shattered kneecaps occur in car accidents.   Rest when you are tired.  SEEK MEDICAL CARE IF:  You have knee pain that is continual and does not   seem to be getting better.  SEEK IMMEDIATE MEDICAL CARE IF:  Your knee joint feels hot to the touch and you have a high fever. MAKE SURE YOU:   Understand these instructions.   Will watch your condition.   Will get help right away if you are not doing well or get worse.  Document Released: 10/18/2006 Document Revised: 12/10/2010 Document Reviewed: 10/18/2006 [facility] Patient Information 2012 [place], [place].Joint Sprain A sprain is a tear or stretch in the ligaments that hold a joint together. Severe sprains may need as long as 3-6 weeks of immobilization and/or exercises to heal completely. Sprained joints should be rested and protected. If not, they can become unstable and prone to re-injury. Proper treatment can reduce your pain, shorten the period of disability, and reduce the risk of repeated injuries. TREATMENT   Rest and elevate the injured  joint to reduce pain and swelling.   Apply ice packs to the injury for 20-30 minutes every 2-3 hours for the next 2-3 days.   Keep the injury wrapped in a compression bandage or splint as long as the joint is painful or as instructed by your caregiver.   Do not use the injured joint until it is completely healed to prevent re-injury and chronic instability. Follow the instructions of your caregiver.   Long-term sprain management may require exercises and/or treatment by a physical therapist. Taping or special braces may help stabilize the joint until it is completely better.  SEEK MEDICAL CARE IF:   You develop increased pain or swelling of the joint.   You develop increasing redness and warmth of the joint.   You develop a fever.   It becomes stiff.   Your hand or foot gets cold or numb.  Document Released: 01/29/2004 Document Revised: 12/10/2010 Document Reviewed: 01/08/2008 [facility] Patient Information 2012 [place], [place].

## 2011-05-28 NOTE — ED Notes (Signed)
Patient states she felt her left knee pop out of joint 4 days ago with a twisting motion.  States she felt it pop back into joint.  Now has pain and swelling which is increasing.  States she had the same approximately 20 years ago.

## 2011-05-31 NOTE — ED Provider Notes (Signed)
History     CSN: [id number]  Arrival date & time 05/28/11  1321   First MD Initiated Contact with Patient 05/28/11 1421      Chief Complaint  Patient presents with  . Knee Pain    left    (Consider location/radiation/quality/duration/timing/severity/associated sxs/prior treatment) HPI Patient presents complaining of 9/10 left knee pain that has persisted for 4 days since she felt a pop in her knee that occurred while walking.  The patient has history of similar injury 20 years ago but never sought medical care at that time. Pain is worse with movement and better with rest.  She has not gotten much relief from the ibuprofen she has tried.  She denies any other symptoms.  There are no other associated or modifying factors.  History reviewed. No pertinent past medical history.  Past Surgical History  Procedure Date  . Cesarean section   . Tubal ligation   . Abdominal hysterectomy   . Uterine ablation     No family history on file.  History  Substance Use Topics  . Smoking status: Never Smoker   . Smokeless tobacco: Not on file  . Alcohol Use: No    OB History    Grav Para Term Preterm Abortions TAB SAB Ect Mult Living                  Review of Systems  Constitutional: Negative.   HENT: Negative.   Eyes: Negative.   Respiratory: Negative.   Cardiovascular: Negative.   Gastrointestinal: Negative.   Genitourinary: Negative.   Musculoskeletal:       Left knee pain  Skin: Negative.   Neurological: Negative.   Hematological: Negative.   Psychiatric/Behavioral: Negative.   All other systems reviewed and are negative.    Allergies  Review of patient's allergies indicates no known allergies.  Home Medications   Current Outpatient Rx  Name Route Sig Dispense Refill  . FERROUS SULFATE 325 (65 FE) MG PO TABS Oral Take 325 mg by mouth daily with breakfast.      . TRAMADOL HCL 50 MG PO TABS Oral Take 1 tablet (50 mg total) by mouth once. 30 tablet 0    BP  98/72  Pulse 77  Temp(Src) 98.6 F (37 C) (Oral)  Resp 20  Ht 5\' 10"  (1.778 m)  Wt 190 lb (86.183 kg)  BMI 27.26 kg/m2  SpO2 100%  Physical Exam  Nursing note and vitals reviewed. GEN: Well-developed, well-nourished female in no distress HEENT: Atraumatic, normocephalic.  EYES: PERRLA BL, no scleral icterus. NECK: Trachea midline, no meningismus CV: regular rate and rhythm.  PULM: No respiratory distress.   Neuro: cranial nerves 2-12 grossly intact, no abnormalities of strength or sensation, A and O x 3 MSK: left knee with no obvious ligamentous laxity, no deformity noted, TTP over the inferomedial aspect, mild effusion.  No other abnormalities noted Skin: No rashes petechiae, purpura, or jaundice Psych: no abnormality of mood   ED Course  Procedures (including critical care time)  Labs Reviewed - No data to display No results found.   1. Left knee sprain       MDM  Patient was evaluated by myself.  Plain film was perfromed and reviewed by myself.  There was no evidence of fracture or obvious injury.  Patient was given ice pack and was discharged with prescription for ultram.  She was given the name of Dr. Pearletha Forge for follow-up and discharged in good condition.  Cyndra Numbers, MD 05/31/11 2024

## 2011-06-03 ENCOUNTER — Other Ambulatory Visit: Payer: Self-pay | Admitting: Specialist

## 2011-06-03 ENCOUNTER — Ambulatory Visit
Admission: RE | Admit: 2011-06-03 | Discharge: 2011-06-03 | Disposition: A | Discharge status: Home / Self Care | Payer: Managed Care, Other (non HMO) | Source: Ambulatory Visit | Attending: Specialist | Admitting: Specialist

## 2011-06-03 DIAGNOSIS — M25562 Pain in left knee: Secondary | ICD-10-CM

## 2011-06-30 ENCOUNTER — Ambulatory Visit (INDEPENDENT_AMBULATORY_CARE_PROVIDER_SITE_OTHER): Payer: Managed Care, Other (non HMO) | Admitting: Physician Assistant

## 2011-06-30 ENCOUNTER — Other Ambulatory Visit: Payer: Self-pay | Admitting: Family Medicine

## 2011-06-30 VITALS — BP 111/70 | HR 71 | Temp 98.0°F | Resp 16 | Ht 68.0 in | Wt 200.0 lb

## 2011-06-30 DIAGNOSIS — D649 Anemia, unspecified: Secondary | ICD-10-CM

## 2011-06-30 DIAGNOSIS — R102 Pelvic and perineal pain: Secondary | ICD-10-CM

## 2011-06-30 DIAGNOSIS — N938 Other specified abnormal uterine and vaginal bleeding: Secondary | ICD-10-CM

## 2011-06-30 DIAGNOSIS — N949 Unspecified condition associated with female genital organs and menstrual cycle: Secondary | ICD-10-CM

## 2011-06-30 DIAGNOSIS — J019 Acute sinusitis, unspecified: Secondary | ICD-10-CM

## 2011-06-30 LAB — POCT CBC
Granulocyte percent: 49.9 %G (ref 37–80)
Lymph, poc: 1.8 (ref 0.6–3.4)
MCHC: 23.4 g/dL — AB (ref 31.8–35.4)
MID (cbc): 0.3 (ref 0–0.9)
POC Granulocyte: 2.1 (ref 2–6.9)
POC LYMPH PERCENT: 42 %L (ref 10–50)
POC MID %: 8.1 %M (ref 0–12)
Platelet Count, POC: 245 10*3/uL (ref 142–424)
RDW, POC: 14.1 %

## 2011-06-30 LAB — POCT WET PREP WITH KOH

## 2011-06-30 LAB — POCT URINE PREGNANCY: Preg Test, Ur: NEGATIVE

## 2011-06-30 LAB — COMPREHENSIVE METABOLIC PANEL
AST: 13 U/L (ref 0–37)
Albumin: 4.1 g/dL (ref 3.5–5.2)
Alkaline Phosphatase: 55 U/L (ref 39–117)
Glucose, Bld: 92 mg/dL (ref 70–99)
Potassium: 4 mEq/L (ref 3.5–5.3)
Sodium: 141 mEq/L (ref 135–145)
Total Bilirubin: 0.6 mg/dL (ref 0.3–1.2)
Total Protein: 7 g/dL (ref 6.0–8.3)

## 2011-06-30 LAB — POCT URINALYSIS DIPSTICK
Blood, UA: NEGATIVE
Leukocytes, UA: NEGATIVE
Nitrite, UA: NEGATIVE
Protein, UA: NEGATIVE
Urobilinogen, UA: 1
pH, UA: 8.5

## 2011-06-30 LAB — POCT UA - MICROSCOPIC ONLY
Crystals, Ur, HPF, POC: NEGATIVE
Yeast, UA: NEGATIVE

## 2011-06-30 MED ORDER — NAPROXEN 500 MG PO TABS: ORAL_TABLET | ORAL | Status: DC

## 2011-06-30 MED ORDER — TRAMADOL HCL 50 MG PO TABS: 50.0000 mg | ORAL_TABLET | Freq: Three times a day (TID) | ORAL | Status: DC | PRN

## 2011-06-30 MED ORDER — FLUCONAZOLE 150 MG PO TABS: 150.0000 mg | ORAL_TABLET | Freq: Once | ORAL | Status: AC

## 2011-06-30 MED ORDER — FLUTICASONE PROPIONATE 50 MCG/ACT NA SUSP: 2.0000 | Freq: Every day | NASAL | Status: DC

## 2011-06-30 MED ORDER — AMOXICILLIN-POT CLAVULANATE 875-125 MG PO TABS: 1.0000 | ORAL_TABLET | Freq: Two times a day (BID) | ORAL | Status: AC

## 2011-06-30 NOTE — Progress Notes (Signed)
Subjective:    Patient ID: [name], female    DOB: 1970-07-04, 41 y.o.   MRN: [id number]  HPI 41 yr old AAF presents with sinus pain and drainage X 5 days.  She had sinus lavage at the ENT 1 month ago and put on 10days of Augmentin (she finished it~2 weeks ago).  No discolored mucus yet.  Also c/o 2 months of spotting.  She is concerned bc she had endometrial ablation February 2012.  She has been having cramping monthly at time of menses since ablation, but no spotting before now. Pap WNL < 1 year ago.  Denies vaginal discharge and pelvic pain other than cramping at time of menses. Denies changes in BMs. No change in appetite.  No n/v/d.  Review of Systems  All other systems reviewed and are negative.       Objective:   Physical Exam  Nursing note and vitals reviewed. Constitutional: She is oriented to person, place, and time. She appears well-developed and well-nourished.  HENT:  Head: Normocephalic and atraumatic.  Right Ear: External ear normal.  Left Ear: External ear normal.  Mouth/Throat: Oropharynx is clear and moist. No oropharyngeal exudate.       Turbinates enlarged and boggy/bluish, larger on L.  Mild swelling over L max sinus  Neck: Normal range of motion. Neck supple.  Cardiovascular: Normal rate, regular rhythm and normal heart sounds.   Pulmonary/Chest: Effort normal and breath sounds normal.  Genitourinary: Vagina normal. No vaginal discharge (scant d/c present-appears physiologic, wet prep taken) found.       Firmness in pelvis ~8cm and c/o L pelvic pain when the area is manipulated.    Neurological: She is alert and oriented to person, place, and time.  Skin: Skin is warm and dry.    Results for orders placed in visit on 06/30/11  POCT CBC      Component Value Range   WBC 4.3 (*) 4.6 - 10.2 K/uL   Lymph, poc 1.8  0.6 - 3.4   POC LYMPH PERCENT 42.0  10 - 50 %L   MID (cbc) 0.3  0 - 0.9   POC MID % 8.1  0 - 12 %M   POC Granulocyte 2.1  2 - 6.9   Granulocyte percent 49.9  37 - 80 %G   RBC 4.78  4.04 - 5.48 M/uL   Hemoglobin 11.2 (*) 12.2 - 16.2 g/dL   HCT, POC 95.2 (*) 84.1 - 47.9 %   MCV 77.3 (*) 80 - 97 fL   MCH, POC 23.4 (*) 27 - 31.2 pg   MCHC 23.4 (*) 31.8 - 35.4 g/dL   RDW, POC 32.4     Platelet Count, POC 245  142 - 424 K/uL   MPV 9.8  0 - 99.8 fL  POCT WET PREP WITH KOH      Component Value Range   Trichomonas, UA Negative     Clue Cells Wet Prep HPF POC 2-4     Epithelial Wet Prep HPF POC 3-8     Yeast Wet Prep HPF POC neg     Bacteria Wet Prep HPF POC 1+     RBC Wet Prep HPF POC 0-1     WBC Wet Prep HPF POC 0-2     KOH Prep POC Negative    POCT UA - MICROSCOPIC ONLY      Component Value Range   WBC, Ur, HPF, POC neg     RBC, urine, microscopic 0-2  Bacteria, U Microscopic neg     Mucus, UA neg     Epithelial cells, urine per micros 0-1     Crystals, Ur, HPF, POC neg     Casts, Ur, LPF, POC neg     Yeast, UA neg    POCT URINE PREGNANCY      Component Value Range   Preg Test, Ur Negative    POCT URINALYSIS DIPSTICK      Component Value Range   Color, UA yellow     Clarity, UA clear     Glucose, UA neg     Bilirubin, UA neg     Ketones, UA neg     Spec Grav, UA 1.015     Blood, UA neg     pH, UA 8.5     Protein, UA neg     Urobilinogen, UA 1.0     Nitrite, UA neg     Leukocytes, UA Negative           Assessment & Plan:  Dysfunctional uterine bleeding s/p Ablation procedure just over 1 year ago Sinus inflammation-start flonase-if infection ensues, she can take the antibiotics. Schedule pelvic u/s-R/O mass, fibroids, etc. Anemia-Hgb 11.2 (but was 7 at time of ablation!)-continue iron and recheck hgb in a few months.

## 2011-07-05 ENCOUNTER — Ambulatory Visit
Admission: RE | Admit: 2011-07-05 | Discharge: 2011-07-05 | Disposition: A | Discharge status: Home / Self Care | Payer: Managed Care, Other (non HMO) | Source: Ambulatory Visit | Attending: Physician Assistant | Admitting: Physician Assistant

## 2011-07-05 DIAGNOSIS — R102 Pelvic and perineal pain: Secondary | ICD-10-CM

## 2011-07-05 DIAGNOSIS — N938 Other specified abnormal uterine and vaginal bleeding: Secondary | ICD-10-CM

## 2011-07-08 ENCOUNTER — Encounter (HOSPITAL_BASED_OUTPATIENT_CLINIC_OR_DEPARTMENT_OTHER): Payer: Self-pay | Admitting: *Deleted

## 2011-07-08 ENCOUNTER — Emergency Department (HOSPITAL_BASED_OUTPATIENT_CLINIC_OR_DEPARTMENT_OTHER)
Admission: EM | Admit: 2011-07-08 | Discharge: 2011-07-08 | Disposition: A | Discharge status: Home / Self Care | Payer: Managed Care, Other (non HMO) | Attending: Emergency Medicine | Admitting: Emergency Medicine

## 2011-07-08 DIAGNOSIS — Z043 Encounter for examination and observation following other accident: Secondary | ICD-10-CM | POA: Insufficient documentation

## 2011-07-08 MED ORDER — TRAMADOL HCL 50 MG PO TABS: 50.0000 mg | ORAL_TABLET | Freq: Four times a day (QID) | ORAL | Status: AC | PRN
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
## 2011-07-16 ENCOUNTER — Other Ambulatory Visit: Payer: Self-pay | Admitting: Physician Assistant

## 2011-07-28 ENCOUNTER — Emergency Department (HOSPITAL_BASED_OUTPATIENT_CLINIC_OR_DEPARTMENT_OTHER)
Admission: EM | Admit: 2011-07-28 | Discharge: 2011-07-28 | Disposition: A | Discharge status: Home / Self Care | Payer: Managed Care, Other (non HMO) | Attending: Emergency Medicine | Admitting: Emergency Medicine

## 2011-07-28 ENCOUNTER — Encounter (HOSPITAL_BASED_OUTPATIENT_CLINIC_OR_DEPARTMENT_OTHER): Payer: Self-pay | Admitting: *Deleted

## 2011-07-28 ENCOUNTER — Emergency Department (HOSPITAL_BASED_OUTPATIENT_CLINIC_OR_DEPARTMENT_OTHER): Payer: Managed Care, Other (non HMO)

## 2011-07-28 DIAGNOSIS — M545 Low back pain, unspecified: Secondary | ICD-10-CM | POA: Insufficient documentation

## 2011-07-28 DIAGNOSIS — S39012A Strain of muscle, fascia and tendon of lower back, initial encounter: Secondary | ICD-10-CM

## 2011-07-28 HISTORY — DX: Anemia, unspecified: D64.9

## 2011-07-28 MED ORDER — IBUPROFEN 800 MG PO TABS
800.0000 mg | ORAL_TABLET | Freq: Once | ORAL | Status: AC
  Administered 2011-07-28: 800 mg via ORAL
  Filled 2011-07-28: qty 1
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
## 2011-07-29 ENCOUNTER — Ambulatory Visit (INDEPENDENT_AMBULATORY_CARE_PROVIDER_SITE_OTHER): Payer: Managed Care, Other (non HMO) | Admitting: Family Medicine

## 2011-07-29 ENCOUNTER — Encounter: Payer: Self-pay | Admitting: Family Medicine

## 2011-07-29 VITALS — BP 113/80 | HR 86 | Temp 98.6°F | Ht 70.0 in | Wt 195.0 lb

## 2011-07-29 DIAGNOSIS — M25561 Pain in right knee: Secondary | ICD-10-CM

## 2011-07-29 DIAGNOSIS — M25569 Pain in unspecified knee: Secondary | ICD-10-CM

## 2011-07-29 DIAGNOSIS — M545 Low back pain: Secondary | ICD-10-CM

## 2011-07-29 MED ORDER — IBUPROFEN 800 MG PO TABS: 800.0000 mg | ORAL_TABLET | Freq: Three times a day (TID) | ORAL | Status: DC | PRN

## 2011-07-29 MED ORDER — IBUPROFEN 800 MG PO TABS: 800.0000 mg | ORAL_TABLET | Freq: Three times a day (TID) | ORAL | Status: AC | PRN

## 2011-07-29 MED ORDER — TRAMADOL HCL 50 MG PO TABS: 50.0000 mg | ORAL_TABLET | Freq: Four times a day (QID) | ORAL | Status: DC | PRN

## 2011-07-29 MED ORDER — TRAMADOL HCL 50 MG PO TABS: 50.0000 mg | ORAL_TABLET | Freq: Four times a day (QID) | ORAL | Status: AC | PRN

## 2011-07-29 NOTE — Patient Instructions (Addendum)
You have musculoskeletal low back pain Take tylenol for baseline pain relief (1-2 extra strength tabs 3x/day) Ibuprofen 800mg  three times a day with food for pain and inflammation. Tramadol as needed for severe pain. Consider flexeril as needed for muscle spasms (no driving on this medicine). Stay as active as possible. Start physical therapy and transition to a home exercise program. Do home exercises on days you do not go to physical therapy. Consider massage, chiropractor, physical therapy, and/or acupuncture. Physical therapy has been shown to be helpful while the others have mixed results. Strengthening of low back muscles, abdominal musculature are key for long term pain relief. If not improving, will consider further imaging (MRI).  You also have patellar subluxation (with prior dislocation). Start PT for this to strengthen medial quad and hip muscles to prevent recurrences and alleviate pain. Ibuprofen, tramadol as above. Do home exercises on days you do not go to PT. Ice knee as needed 3-4 times a day. A knee brace is a consideration for support. Follow up with me in 5-6 weeks for reevaluation.

## 2011-07-31 ENCOUNTER — Emergency Department (HOSPITAL_COMMUNITY)
Admission: EM | Admit: 2011-07-31 | Discharge: 2011-07-31 | Disposition: A | Discharge status: Home / Self Care | Payer: Managed Care, Other (non HMO) | Attending: Emergency Medicine | Admitting: Emergency Medicine

## 2011-07-31 ENCOUNTER — Encounter (HOSPITAL_COMMUNITY): Payer: Self-pay | Admitting: Emergency Medicine

## 2011-07-31 DIAGNOSIS — G43909 Migraine, unspecified, not intractable, without status migrainosus: Secondary | ICD-10-CM

## 2011-07-31 DIAGNOSIS — R5383 Other fatigue: Secondary | ICD-10-CM | POA: Insufficient documentation

## 2011-07-31 DIAGNOSIS — R5381 Other malaise: Secondary | ICD-10-CM | POA: Insufficient documentation

## 2011-07-31 HISTORY — DX: Migraine, unspecified, not intractable, without status migrainosus: G43.909

## 2011-07-31 MED ORDER — METOCLOPRAMIDE HCL 5 MG/ML IJ SOLN
10.0000 mg | Freq: Once | INTRAMUSCULAR | Status: AC
  Administered 2011-07-31: 10 mg via INTRAVENOUS
  Filled 2011-07-31: qty 2

## 2011-07-31 MED ORDER — DIPHENHYDRAMINE HCL 50 MG/ML IJ SOLN
12.5000 mg | Freq: Once | INTRAMUSCULAR | Status: AC
  Administered 2011-07-31: 12.5 mg via INTRAVENOUS
  Filled 2011-07-31: qty 1

## 2011-07-31 MED ORDER — SODIUM CHLORIDE 0.9 % IV BOLUS (SEPSIS): 1000.0000 mL | Freq: Once | INTRAVENOUS | Status: DC

## 2011-07-31 NOTE — ED Notes (Signed)
Onset of migraine headache Thursday noc, spots in eyes, vision blurry, throbbing, nausea onset today. Rx'ed at home w/ Tylenol, Tramadol Rx'ed for legs but taken for H/A. No relief from either medication

## 2011-08-01 NOTE — ED Provider Notes (Signed)
History     CSN: [id number]  Arrival date & time 07/31/11  1259   First MD Initiated Contact with Patient 07/31/11 1343      Chief Complaint  Patient presents with  . Migraine  . Fatigue    (Consider location/radiation/quality/duration/timing/severity/associated sxs/prior treatment) HPI Patient is a 41 year old female who presents today complaining of her typical migraine which is a bilateral frontal headache rated as a 10 out of 10 described as aching with associated photophobia as well as nausea. Patient has not vomited. CT is tried tramadol and Tylenol at home without relief. Patient denies fevers, vomiting, neck stiffness, rash, and other neurologic symptoms. There are no other associated or modifying factors. Past Medical History  Diagnosis Date  . Anemia     resolved  . Migraine     Past Surgical History  Procedure Date  . Cesarean section   . Tubal ligation   . Uterine ablation     No family history on file.  History  Substance Use Topics  . Smoking status: Never Smoker   . Smokeless tobacco: Not on file  . Alcohol Use: No    OB History    Grav Para Term Preterm Abortions TAB SAB Ect Mult Living                  Review of Systems  Constitutional: Negative.   Eyes: Positive for photophobia.  Respiratory: Negative.   Cardiovascular: Negative.   Gastrointestinal: Positive for nausea.  Genitourinary: Negative.   Musculoskeletal: Negative.   Skin: Negative.   Neurological: Positive for headaches.  Hematological: Negative.   Psychiatric/Behavioral: Negative.   All other systems reviewed and are negative.    Allergies  Morphine and related  Home Medications   Current Outpatient Rx  Name Route Sig Dispense Refill  . FERROUS SULFATE 325 (65 FE) MG PO TABS Oral Take 325 mg by mouth daily with breakfast.      . FLUTICASONE PROPIONATE 50 MCG/ACT NA SUSP Nasal Place 2 sprays into the nose daily. 16 g 11  . IBUPROFEN 800 MG PO TABS Oral Take 1 tablet  (800 mg total) by mouth every 8 (eight) hours as needed for pain. 90 tablet 1  . TRAMADOL HCL 50 MG PO TABS Oral Take 1 tablet (50 mg total) by mouth every 6 (six) hours as needed for pain. 120 tablet 1    BP 109/63  Pulse 71  Temp 98.1 F (36.7 C) (Oral)  Resp 16  SpO2 100%  Physical Exam  Nursing note and vitals reviewed. GEN: Well-developed, well-nourished female in no distress HEENT: Atraumatic, normocephalic. Oropharynx clear without erythema EYES: PERRLA BL, no scleral icterus. NECK: Trachea midline, no meningismus CV: regular rate and rhythm. No murmurs, rubs, or gallops PULM: No respiratory distress.  No crackles, wheezes, or rales. GI: soft, non-tender. No guarding, rebound, or tenderness. + bowel sounds  GU: deferred Neuro: cranial nerves grossly 2-12 intact, no abnormalities of strength or sensation, A and O x 3 MSK: Patient moves all 4 extremities symmetrically, no deformity, edema, or injury noted Skin: No rashes petechiae, purpura, or jaundice Psych: no abnormality of mood   ED Course  Procedures (including critical care time)  Labs Reviewed - No data to display No results found.   1. Migraine       MDM  Patient was evaluated by myself. She was experiencing her typical migraine. Even upon arrival and triaged patient reported that this was improving. She preferred to just received IM medications.  She was given a dose of Reglan and Benadryl. Following this patient was feeling much better. She declined offer of steroid to prevent recurrence. Patient was discharged in good condition and can followup with her primary care physician.        Cyndra Numbers, MD 08/01/11 [phone]

## 2011-08-02 ENCOUNTER — Encounter: Payer: Self-pay | Admitting: Family Medicine

## 2011-08-02 DIAGNOSIS — M25561 Pain in right knee: Secondary | ICD-10-CM | POA: Insufficient documentation

## 2011-08-02 DIAGNOSIS — M545 Low back pain: Secondary | ICD-10-CM | POA: Insufficient documentation

## 2011-08-02 NOTE — Assessment & Plan Note (Signed)
2/2 patellar subluxation (with prior dislocation).  Start formal PT.  Ibuprofen, tramadol, icing.  F/u in 5-6 weeks for reevaluation.
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
## 2011-08-02 NOTE — Assessment & Plan Note (Signed)
2/2 lumbar strain.  Start ibuprofen, tramadol as needed.  Start PT and transition to HEP.  Consider further imaging if not improving as expected.

## 2011-08-04 ENCOUNTER — Ambulatory Visit: Payer: Managed Care, Other (non HMO) | Attending: Family Medicine | Admitting: Rehabilitation

## 2011-09-09 ENCOUNTER — Ambulatory Visit: Payer: Managed Care, Other (non HMO) | Admitting: Family Medicine

## 2011-09-21 ENCOUNTER — Encounter: Payer: Self-pay | Admitting: Family Medicine

## 2011-09-21 ENCOUNTER — Ambulatory Visit (INDEPENDENT_AMBULATORY_CARE_PROVIDER_SITE_OTHER): Payer: Managed Care, Other (non HMO) | Admitting: Family Medicine

## 2011-09-21 VITALS — BP 110/80 | HR 90 | Ht 70.0 in | Wt 200.0 lb

## 2011-09-21 DIAGNOSIS — M25562 Pain in left knee: Secondary | ICD-10-CM

## 2011-09-21 DIAGNOSIS — M25569 Pain in unspecified knee: Secondary | ICD-10-CM

## 2011-09-21 NOTE — Patient Instructions (Addendum)
Your history and exam are most consistent with patellofemoral syndrome though a small lateral meniscus tear is also possible. Both are treated similarly to begin with. Avoid painful activities (especially squats and lunges, plyometrics, increasing running mileage) as much as possible. Cross train with swimming, cycling with low resistance every other day. Straight leg raise, hip side raises, straight leg raise with foot turned outward exercises at least once daily - 3 sets of 10 Start formal physical therapy Consider dr. Jari Sportsman active series inserts to correct mild overpronation. Icing 15 minutes at a time 3-4 times a day Tylenol, ibuprofen, tramadol as needed for pain. Follow up with me in 6 weeks for reevaluation.

## 2011-09-22 ENCOUNTER — Other Ambulatory Visit: Payer: Self-pay | Admitting: Family Medicine

## 2011-09-22 ENCOUNTER — Encounter: Payer: Self-pay | Admitting: Family Medicine
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
## 2011-09-23 DIAGNOSIS — M25562 Pain in left knee: Secondary | ICD-10-CM | POA: Insufficient documentation

## 2011-09-23 NOTE — Assessment & Plan Note (Signed)
Most consistent with patellofemoral syndrome.  Does have some lateral joint line pain as well though but meniscus testing negative.  Start with formal PT (advised there are some locations that have extended and weekend hours - will refer to these).  Home exercise program on days she doesn't go to PT.  Discussed shoe inserts with good arch support.  Tylenol, tramadol, nsaids.  Refill sent in to pharmacy of tramadol.  F/u in 6 weeks for reevaluation.  Can consider MRI if not improving.

## 2011-11-04 ENCOUNTER — Ambulatory Visit: Payer: Managed Care, Other (non HMO) | Admitting: Family Medicine

## 2011-11-18 ENCOUNTER — Other Ambulatory Visit: Payer: Self-pay | Admitting: Family Medicine

## 2011-12-26 ENCOUNTER — Emergency Department (HOSPITAL_BASED_OUTPATIENT_CLINIC_OR_DEPARTMENT_OTHER)
Admission: EM | Admit: 2011-12-26 | Discharge: 2011-12-26 | Disposition: A | Discharge status: Home / Self Care | Payer: Managed Care, Other (non HMO) | Attending: Emergency Medicine | Admitting: Emergency Medicine

## 2011-12-26 ENCOUNTER — Encounter (HOSPITAL_BASED_OUTPATIENT_CLINIC_OR_DEPARTMENT_OTHER): Payer: Self-pay | Admitting: Emergency Medicine

## 2011-12-26 DIAGNOSIS — Z791 Long term (current) use of non-steroidal anti-inflammatories (NSAID): Secondary | ICD-10-CM | POA: Insufficient documentation

## 2011-12-26 DIAGNOSIS — M25569 Pain in unspecified knee: Secondary | ICD-10-CM

## 2011-12-26 DIAGNOSIS — G43909 Migraine, unspecified, not intractable, without status migrainosus: Secondary | ICD-10-CM | POA: Insufficient documentation

## 2011-12-26 DIAGNOSIS — Z79899 Other long term (current) drug therapy: Secondary | ICD-10-CM | POA: Insufficient documentation

## 2011-12-26 DIAGNOSIS — D649 Anemia, unspecified: Secondary | ICD-10-CM | POA: Insufficient documentation

## 2011-12-26 DIAGNOSIS — M2550 Pain in unspecified joint: Secondary | ICD-10-CM | POA: Insufficient documentation

## 2011-12-26 MED ORDER — TRAMADOL HCL 50 MG PO TABS: 50.0000 mg | ORAL_TABLET | Freq: Four times a day (QID) | ORAL | Status: DC | PRN

## 2011-12-26 NOTE — ED Provider Notes (Signed)
History     CSN: [id number]  Arrival date & time 12/26/11  0902   First MD Initiated Contact with Patient 12/26/11 1032      Chief Complaint  Patient presents with  . Knee Pain    (Consider location/radiation/quality/duration/timing/severity/associated sxs/prior treatment) HPI Comments: Patient presents with worsening pain to her right knee pain she's had some chronic problems with both of her knees and has been seeing Dr. Pearletha Forge for the pain. She has a diagnosis of likely patellofemoral syndrome or she's had some intermittent patellar dislocations. She states that she was dancing last night and had a patellar dislocation of her right knee which spontaneously reduced itself when she stood up. She has some constant throbbing to the right knee. It feels similar to the pain she's had in the past. She was previously taking Ultram and Anaprox and for the pain but took her last Ultram this morning. She denies any other recent injuries to her knee.  Patient is a 41 y.o. female presenting with knee pain.  Knee Pain Pertinent negatives include no headaches.    Past Medical History  Diagnosis Date  . Anemia     resolved  . Migraine     Past Surgical History  Procedure Date  . Cesarean section   . Tubal ligation   . Uterine ablation     Family History  Problem Relation Age of Onset  . Hypertension Mother   . Diabetes Mother   . Hypertension Father   . Heart attack Neg Hx   . Hyperlipidemia Neg Hx   . Sudden death Neg Hx     History  Substance Use Topics  . Smoking status: Never Smoker   . Smokeless tobacco: Not on file  . Alcohol Use: No    OB History    Grav Para Term Preterm Abortions TAB SAB Ect Mult Living                  Review of Systems  Constitutional: Negative for fever.  HENT: Negative for neck pain.   Gastrointestinal: Negative for nausea and vomiting.  Musculoskeletal: Positive for joint swelling and arthralgias. Negative for back pain.  Skin:  Negative for wound.  Neurological: Negative for dizziness, weakness, numbness and headaches.  Hematological: Does not bruise/bleed easily.    Allergies  Morphine and related  Home Medications   Current Outpatient Rx  Name  Route  Sig  Dispense  Refill  . NAPROXEN 500 MG PO TABS   Oral   Take 500 mg by mouth 2 (two) times daily with a meal.         . TRAMADOL HCL 50 MG PO TABS      TAKE ONE TABLET BY MOUTH EVERY 6 HOURS AS NEEDED FOR PAIN   120 tablet   0   . FERROUS SULFATE 325 (65 FE) MG PO TABS   Oral   Take 325 mg by mouth daily with breakfast.           . TRAMADOL HCL 50 MG PO TABS   Oral   Take 1 tablet (50 mg total) by mouth every 6 (six) hours as needed for pain.   15 tablet   0     BP 113/78  Pulse 73  Temp 98.7 F (37.1 C) (Oral)  Resp 20  Ht 5\' 10"  (1.778 m)  Wt 200 lb (90.719 kg)  BMI 28.70 kg/m2  SpO2 100%  Physical Exam  Constitutional: She is oriented to person, place, and  time. She appears well-developed and well-nourished.  HENT:  Head: Normocephalic and atraumatic.  Cardiovascular: Normal rate.   Pulmonary/Chest: Effort normal.  Musculoskeletal:       Patient has some mild swelling to the right knee. No significant effusion is noted. She has tenderness along the lateral joint line. She is able to do a straight leg raise. The ligaments appear stable. She's neurovascularly intact.  Neurological: She is alert and oriented to person, place, and time.  Skin: Skin is warm and dry.    ED Course  Procedures (including critical care time)  Labs Reviewed - No data to display No results found.   1. Knee pain       MDM  Patient status post patellar dislocation spontaneously reduced. She's had similar incidents in the past. She has no new injuries to the knee. She's had x-rays of the knees in the past and I did not feel that further imaging is indicated today. I did give her prescription for Ultram to use as well as her naproxen. She does  note that she needs to ice and elevate her leg. She does have a knee sleeve to use at home. She will make an appointment to followup with Dr. Pearletha Forge.        Rolan Bucco, MD 12/26/11 1054

## 2011-12-26 NOTE — ED Notes (Signed)
Pt states she aggravated her right knee last night.  She felt the knee pop out and back in joint.  Pt has old injury to same knee and pt states it pops out from time to time.

## 2012-01-03 ENCOUNTER — Other Ambulatory Visit: Payer: Self-pay | Admitting: Family Medicine

## 2012-01-11 ENCOUNTER — Ambulatory Visit: Payer: Managed Care, Other (non HMO) | Admitting: Family Medicine

## 2012-03-22 ENCOUNTER — Telehealth: Payer: Self-pay | Admitting: Internal Medicine

## 2012-03-22 NOTE — Telephone Encounter (Signed)
S/W PT IN REF TO NP APPT. ON 04/24/12@9 :30 REFERRING DR Rolm Bookbinder MAILED NP PACKET

## 2012-03-23 ENCOUNTER — Telehealth: Payer: Self-pay | Admitting: Internal Medicine

## 2012-03-23 NOTE — Telephone Encounter (Signed)
C/D 03/23/12 for appt. 04/24/12

## 2012-04-19 ENCOUNTER — Telehealth: Payer: Self-pay | Admitting: *Deleted

## 2012-04-19 NOTE — Telephone Encounter (Signed)
Spoke with pt regarding appt time change.  Pt is to be seen at 9:45 on 04/24/12 per Dr. Asa Lente request.  Pt verbalized understanding of time and place of appt

## 2012-04-19 NOTE — Telephone Encounter (Signed)
Per [name] I have adjusted appt for 4/21.  JMW

## 2012-04-24 ENCOUNTER — Ambulatory Visit: Payer: Managed Care, Other (non HMO) | Admitting: Internal Medicine

## 2012-04-24 ENCOUNTER — Ambulatory Visit: Payer: Managed Care, Other (non HMO)

## 2012-04-24 ENCOUNTER — Encounter: Payer: Self-pay | Admitting: Lab

## 2012-04-24 ENCOUNTER — Other Ambulatory Visit: Payer: Managed Care, Other (non HMO) | Admitting: Lab

## 2012-04-25 NOTE — Progress Notes (Signed)
No show

## 2012-07-19 ENCOUNTER — Emergency Department (HOSPITAL_BASED_OUTPATIENT_CLINIC_OR_DEPARTMENT_OTHER): Payer: BC Managed Care – PPO

## 2012-07-19 ENCOUNTER — Encounter (HOSPITAL_BASED_OUTPATIENT_CLINIC_OR_DEPARTMENT_OTHER): Payer: Self-pay

## 2012-07-19 ENCOUNTER — Emergency Department (HOSPITAL_BASED_OUTPATIENT_CLINIC_OR_DEPARTMENT_OTHER)
Admission: EM | Admit: 2012-07-19 | Discharge: 2012-07-19 | Disposition: A | Discharge status: Home / Self Care | Payer: BC Managed Care – PPO | Attending: Emergency Medicine | Admitting: Emergency Medicine

## 2012-07-19 DIAGNOSIS — S83006A Unspecified dislocation of unspecified patella, initial encounter: Secondary | ICD-10-CM | POA: Insufficient documentation

## 2012-07-19 DIAGNOSIS — Y929 Unspecified place or not applicable: Secondary | ICD-10-CM | POA: Insufficient documentation

## 2012-07-19 DIAGNOSIS — S83004A Unspecified dislocation of right patella, initial encounter: Secondary | ICD-10-CM

## 2012-07-19 DIAGNOSIS — Y939 Activity, unspecified: Secondary | ICD-10-CM | POA: Insufficient documentation

## 2012-07-19 DIAGNOSIS — Z8679 Personal history of other diseases of the circulatory system: Secondary | ICD-10-CM | POA: Insufficient documentation

## 2012-07-19 DIAGNOSIS — X500XXA Overexertion from strenuous movement or load, initial encounter: Secondary | ICD-10-CM | POA: Insufficient documentation

## 2012-07-19 DIAGNOSIS — D649 Anemia, unspecified: Secondary | ICD-10-CM | POA: Insufficient documentation

## 2012-07-19 MED ORDER — TRAMADOL HCL 50 MG PO TABS: 50.0000 mg | ORAL_TABLET | Freq: Four times a day (QID) | ORAL | Status: DC | PRN

## 2012-07-19 NOTE — ED Provider Notes (Signed)
History    CSN: [id number] Arrival date & time 07/19/12  1352  First MD Initiated Contact with Patient 07/19/12 1419     Chief Complaint  Patient presents with  . Knee Injury   (Consider location/radiation/quality/duration/timing/severity/associated sxs/prior Treatment) Patient is a 42 y.o. female presenting with knee pain. The history is provided by the patient. No language interpreter was used.  Knee Pain Location:  Knee Knee location:  R knee Pain details:    Quality:  Aching   Radiates to:  Does not radiate   Severity:  Moderate   Progression:  Worsening Dislocation: yes   Foreign body present:  No foreign bodies Tetanus status:  Out of date Worsened by:  Nothing tried Ineffective treatments:  None tried Pt complains knee cap popped out of joint.  Pt complains of swelling and pain.  Pt reports it has happened multiple times.  Pt has seen Dr. Pearletha Forge in the past.  Pt is interested in Surgical Ortho repair Past Medical History  Diagnosis Date  . Anemia     resolved  . Migraine    Past Surgical History  Procedure Laterality Date  . Cesarean section    . Tubal ligation    . Uterine ablation     Family History  Problem Relation Age of Onset  . Hypertension Mother   . Diabetes Mother   . Hypertension Father   . Heart attack Neg Hx   . Hyperlipidemia Neg Hx   . Sudden death Neg Hx    History  Substance Use Topics  . Smoking status: Never Smoker   . Smokeless tobacco: Not on file  . Alcohol Use: No   OB History   Grav Para Term Preterm Abortions TAB SAB Ect Mult Living                 Review of Systems  Musculoskeletal: Positive for myalgias. Negative for joint swelling.  All other systems reviewed and are negative.    Allergies  Morphine and related  Home Medications   Current Outpatient Rx  Name  Route  Sig  Dispense  Refill  . ferrous sulfate 325 (65 FE) MG tablet   Oral   Take 325 mg by mouth daily with breakfast.           . naproxen  (NAPROSYN) 500 MG tablet   Oral   Take 500 mg by mouth 2 (two) times daily with a meal.         . traMADol (ULTRAM) 50 MG tablet      TAKE ONE TABLET BY MOUTH EVERY 6 HOURS AS NEEDED FOR PAIN   120 tablet   0   . traMADol (ULTRAM) 50 MG tablet   Oral   Take 1 tablet (50 mg total) by mouth every 6 (six) hours as needed for pain.   15 tablet   0    BP 128/67  Pulse 80  Temp(Src) 98.3 F (36.8 C) (Oral)  Resp 16  Ht 5\' 10"  (1.778 m)  Wt 195 lb (88.451 kg)  BMI 27.98 kg/m2  SpO2 100% Physical Exam  Nursing note and vitals reviewed. Constitutional: She is oriented to person, place, and time. She appears well-developed and well-nourished.  HENT:  Head: Normocephalic.  Musculoskeletal: She exhibits tenderness.  Tender right knee, from,  nv and ns intact,    Neurological: She is alert and oriented to person, place, and time. She has normal reflexes.  Skin: Skin is warm.  Psychiatric: She has a  normal mood and affect.    ED Course  Procedures (including critical care time) Labs Reviewed - No data to display Dg Knee Complete 4 Views Right  07/19/2012   *RADIOLOGY REPORT*  Clinical Data: Larey Seat.  Injured right knee.  RIGHT KNEE - COMPLETE 4+ VIEW  Comparison: 01/03/2008.  Findings: Stable remote avulsion type fracture involving the medial femoral condyle.  The joint spaces are maintained.  No acute fracture or degenerative changes.  No joint effusion.  IMPRESSION: No acute bony findings.   Original Report Authenticated By: Rudie Meyer, M.D.   No diagnosis found.  MDM  Knee immbolizer, crutches, ultram,   Follow up with Dr. Luiz Blare for evaluation  Elson Areas, PA-C 07/19/12 1523

## 2012-07-19 NOTE — ED Provider Notes (Signed)
Medical screening examination/treatment/procedure(s) were performed by non-physician practitioner and as supervising physician I was immediately available for consultation/collaboration.  Kallum Jorgensen, MD 07/19/12 1841 

## 2012-07-19 NOTE — ED Notes (Signed)
Pt reports right knee "popped out of joint" yesterday-steady gait into triage

## 2013-02-07 ENCOUNTER — Encounter (HOSPITAL_BASED_OUTPATIENT_CLINIC_OR_DEPARTMENT_OTHER): Payer: Self-pay | Admitting: Emergency Medicine

## 2013-02-07 ENCOUNTER — Emergency Department (HOSPITAL_BASED_OUTPATIENT_CLINIC_OR_DEPARTMENT_OTHER)
Admission: EM | Admit: 2013-02-07 | Discharge: 2013-02-07 | Disposition: A | Discharge status: Home / Self Care | Payer: BC Managed Care – PPO | Attending: Emergency Medicine | Admitting: Emergency Medicine

## 2013-02-07 DIAGNOSIS — Z792 Long term (current) use of antibiotics: Secondary | ICD-10-CM | POA: Insufficient documentation

## 2013-02-07 DIAGNOSIS — K047 Periapical abscess without sinus: Secondary | ICD-10-CM | POA: Insufficient documentation

## 2013-02-07 DIAGNOSIS — D649 Anemia, unspecified: Secondary | ICD-10-CM | POA: Insufficient documentation

## 2013-02-07 DIAGNOSIS — IMO0002 Reserved for concepts with insufficient information to code with codable children: Secondary | ICD-10-CM | POA: Insufficient documentation

## 2013-02-07 DIAGNOSIS — Z8679 Personal history of other diseases of the circulatory system: Secondary | ICD-10-CM | POA: Insufficient documentation

## 2013-02-07 DIAGNOSIS — Z79899 Other long term (current) drug therapy: Secondary | ICD-10-CM | POA: Insufficient documentation

## 2013-02-07 MED ORDER — PREDNISONE 10 MG PO TABS: 20.0000 mg | ORAL_TABLET | Freq: Every day | ORAL | Status: DC

## 2013-02-07 MED ORDER — TRAMADOL HCL 50 MG PO TABS: 50.0000 mg | ORAL_TABLET | Freq: Four times a day (QID) | ORAL | Status: DC | PRN

## 2013-02-07 MED ORDER — PENICILLIN V POTASSIUM 500 MG PO TABS: 500.0000 mg | ORAL_TABLET | Freq: Four times a day (QID) | ORAL | Status: DC

## 2013-02-07 NOTE — ED Notes (Signed)
Facial swelling x 2 days with possible dental abscess.

## 2013-02-07 NOTE — ED Provider Notes (Signed)
CSN: [id number]     Arrival date & time 02/07/13  1123 History   First MD Initiated Contact with Patient 02/07/13 1137     Chief Complaint  Patient presents with  . Facial Swelling    HPI  Patient presents with pain and swelling adjacent her lower teeth for the last several days. She's had pain around the tooth for several weeks and became more swollen and painful to her 3 days ago. No difficulty with swallowing. She has difficulty with chewing as it is painful on that side. No difficulty with breathing. No fevers no chills.  Past Medical History  Diagnosis Date  . Anemia     resolved  . Migraine    Past Surgical History  Procedure Laterality Date  . Cesarean section    . Tubal ligation    . Uterine ablation     Family History  Problem Relation Age of Onset  . Hypertension Mother   . Diabetes Mother   . Hypertension Father   . Heart attack Neg Hx   . Hyperlipidemia Neg Hx   . Sudden death Neg Hx    History  Substance Use Topics  . Smoking status: Never Smoker   . Smokeless tobacco: Not on file  . Alcohol Use: Yes     Comment: occasional   OB History   Grav Para Term Preterm Abortions TAB SAB Ect Mult Living                 Review of Systems  Constitutional: Negative for chills, diaphoresis, appetite change and fatigue.  HENT: Positive for dental problem. Negative for drooling, mouth sores, sore throat and trouble swallowing.        Pain with chewing. Swelling adjacent her teeth.  Eyes: Negative for visual disturbance.  Respiratory: Negative for cough, chest tightness, shortness of breath and wheezing.   Cardiovascular: Negative for chest pain.  Gastrointestinal: Negative for nausea, vomiting, abdominal pain, diarrhea and abdominal distention.  Endocrine: Negative for polydipsia, polyphagia and polyuria.  Genitourinary: Negative for dysuria, frequency and hematuria.  Musculoskeletal: Negative for gait problem.  Skin: Negative for color change, pallor and rash.    Neurological: Negative for dizziness, syncope, light-headedness and headaches.  Hematological: Does not bruise/bleed easily.  Psychiatric/Behavioral: Negative for behavioral problems and confusion.    Allergies  Morphine and related  Home Medications   Current Outpatient Rx  Name  Route  Sig  Dispense  Refill  . ferrous sulfate 325 (65 FE) MG tablet   Oral   Take 325 mg by mouth daily with breakfast.           . naproxen (NAPROSYN) 500 MG tablet   Oral   Take 500 mg by mouth 2 (two) times daily with a meal.         . penicillin v potassium (VEETID) 500 MG tablet   Oral   Take 1 tablet (500 mg total) by mouth 4 (four) times daily.   40 tablet   0   . predniSONE (DELTASONE) 10 MG tablet   Oral   Take 2 tablets (20 mg total) by mouth daily.   10 tablet   0   . traMADol (ULTRAM) 50 MG tablet      TAKE ONE TABLET BY MOUTH EVERY 6 HOURS AS NEEDED FOR PAIN   120 tablet   0   . traMADol (ULTRAM) 50 MG tablet   Oral   Take 1 tablet (50 mg total) by mouth every 6 (six) hours  as needed for pain.   15 tablet   0   . traMADol (ULTRAM) 50 MG tablet   Oral   Take 1 tablet (50 mg total) by mouth every 6 (six) hours as needed for pain.   20 tablet   0   . traMADol (ULTRAM) 50 MG tablet   Oral   Take 1 tablet (50 mg total) by mouth every 6 (six) hours as needed.   15 tablet   0    BP 104/49  Pulse 82  Temp(Src) 98.1 F (36.7 C) (Oral)  Resp 16  Ht 5\' 9"  (1.753 m)  Wt 187 lb (84.823 kg)  BMI 27.60 kg/m2  SpO2 100% Physical Exam  Constitutional: She is oriented to person, place, and time. She appears well-developed and well-nourished. No distress.  HENT:  Head: Normocephalic.    As there is soft tissue swelling adjacent her versus second molars on the buccal surface of the left mandible. She opens her mouth there is ongoing. The drainage from the area. With gentle massage adjacent there is it continued and additional drainage noted. She has slight trismus due  to her discomfort.  Eyes: Conjunctivae are normal. Pupils are equal, round, and reactive to light. No scleral icterus.  Neck: Normal range of motion. Neck supple. No thyromegaly present.  Cardiovascular: Normal rate and regular rhythm.  Exam reveals no gallop and no friction rub.   No murmur heard. Pulmonary/Chest: Effort normal and breath sounds normal. No respiratory distress. She has no wheezes. She has no rales.  Abdominal: Soft. Bowel sounds are normal. She exhibits no distension. There is no tenderness. There is no rebound.  Musculoskeletal: Normal range of motion.  Neurological: She is alert and oriented to person, place, and time.  Skin: Skin is warm and dry. No rash noted.  Psychiatric: She has a normal mood and affect. Her behavior is normal.    ED Course  Procedures (including critical care time) Labs Review Labs Reviewed - No data to display Imaging Review No results found.  EKG Interpretation   None       MDM   1. Dental abscess    There is ongoing drainage from the periodontal abscess. Is able to massage express some additional purulence from the area. Have encouraged her to continue to use swishing and gargling and spitting to promote some additional drainage. Her dentist for followup as soon as possible.    Rolland Porter, MD 02/07/13 [phone]

## 2013-02-07 NOTE — Discharge Instructions (Signed)
The abscess near tooth appears to have spontaneously ruptured and is draining. This should help the swelling and pain. Swish and spit warm water several times per day to help drainage. Recheck with your dentist as soon as possible. Return here with any worsening of swelling, pain, or other changes.  Dental Abscess A dental abscess is a collection of infected fluid (pus) from a bacterial infection in the inner part of the tooth (pulp). It usually occurs at the end of the tooth's root.  CAUSES   Severe tooth decay.  Trauma to the tooth that allows bacteria to enter into the pulp, such as a broken or chipped tooth. SYMPTOMS   Severe pain in and around the infected tooth.  Swelling and redness around the abscessed tooth or in the mouth or face.  Tenderness.  Pus drainage.  Bad breath.  Bitter taste in the mouth.  Difficulty swallowing.  Difficulty opening the mouth.  Nausea.  Vomiting.  Chills.  Swollen neck glands. DIAGNOSIS   A medical and dental history will be taken.  An examination will be performed by tapping on the abscessed tooth.  X-rays may be taken of the tooth to identify the abscess. TREATMENT The goal of treatment is to eliminate the infection. You may be prescribed antibiotic medicine to stop the infection from spreading. A root canal may be performed to save the tooth. If the tooth cannot be saved, it may be pulled (extracted) and the abscess may be drained.  HOME CARE INSTRUCTIONS  Only take over-the-counter or prescription medicines for pain, fever, or discomfort as directed by your caregiver.  Rinse your mouth (gargle) often with salt water ( tsp salt in 8 oz [250 ml] of warm water) to relieve pain or swelling.  Do not drive after taking pain medicine (narcotics).  Do not apply heat to the outside of your face.  Return to your dentist for further treatment as directed. SEEK MEDICAL CARE IF:  Your pain is not helped by medicine.  Your pain  is getting worse instead of better. SEEK IMMEDIATE MEDICAL CARE IF:  You have a fever or persistent symptoms for more than 2 3 days.  You have a fever and your symptoms suddenly get worse.  You have chills or a very bad headache.  You have problems breathing or swallowing.  You have trouble opening your mouth.  You have swelling in the neck or around the eye. Document Released: 12/21/2004 Document Revised: 09/15/2011 Document Reviewed: 03/31/2010 [facility] Patient Information 2014 [place], [place].

## 2013-03-14 ENCOUNTER — Encounter (HOSPITAL_COMMUNITY): Payer: Self-pay | Admitting: Emergency Medicine

## 2013-03-14 ENCOUNTER — Emergency Department (INDEPENDENT_AMBULATORY_CARE_PROVIDER_SITE_OTHER)
Admission: EM | Admit: 2013-03-14 | Discharge: 2013-03-14 | Disposition: A | Discharge status: Home / Self Care | Payer: Self-pay | Source: Home / Self Care | Attending: Family Medicine | Admitting: Family Medicine

## 2013-03-14 DIAGNOSIS — S8000XA Contusion of unspecified knee, initial encounter: Secondary | ICD-10-CM

## 2013-03-14 MED ORDER — NAPROXEN 500 MG PO TABS: 500.0000 mg | ORAL_TABLET | Freq: Two times a day (BID) | ORAL | Status: DC

## 2013-03-14 MED ORDER — TRAMADOL HCL 50 MG PO TABS: 50.0000 mg | ORAL_TABLET | Freq: Four times a day (QID) | ORAL | Status: DC | PRN
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
## 2013-03-14 NOTE — Discharge Instructions (Signed)
Thank you for coming in today. Attend physical therpay.  Use naproxen and tramadol for pain as needed.  [facility]

## 2013-03-21 ENCOUNTER — Encounter (HOSPITAL_COMMUNITY): Payer: Self-pay | Admitting: Emergency Medicine

## 2013-03-21 ENCOUNTER — Emergency Department (INDEPENDENT_AMBULATORY_CARE_PROVIDER_SITE_OTHER)
Admission: EM | Admit: 2013-03-21 | Discharge: 2013-03-21 | Disposition: A | Discharge status: Home / Self Care | Payer: Self-pay | Source: Home / Self Care | Attending: Family Medicine | Admitting: Family Medicine

## 2013-03-21 DIAGNOSIS — M545 Low back pain, unspecified: Secondary | ICD-10-CM

## 2013-03-21 DIAGNOSIS — M25561 Pain in right knee: Secondary | ICD-10-CM

## 2013-03-21 DIAGNOSIS — M25569 Pain in unspecified knee: Secondary | ICD-10-CM

## 2013-03-21 MED ORDER — TRAMADOL HCL 50 MG PO TABS: 50.0000 mg | ORAL_TABLET | Freq: Four times a day (QID) | ORAL | Status: DC | PRN

## 2013-03-21 MED ORDER — NAPROXEN 500 MG PO TABS: 500.0000 mg | ORAL_TABLET | Freq: Two times a day (BID) | ORAL | Status: DC
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
## 2013-03-21 NOTE — Discharge Instructions (Signed)
Thank you for coming in today. Continue naproxen and tramadol [facility] Come back or go to the emergency room if you notice new weakness new numbness problems walking or bowel or bladder problems.

## 2013-04-30 ENCOUNTER — Emergency Department (HOSPITAL_BASED_OUTPATIENT_CLINIC_OR_DEPARTMENT_OTHER)
Admission: EM | Admit: 2013-04-30 | Discharge: 2013-04-30 | Disposition: A | Discharge status: Home / Self Care | Payer: BC Managed Care – PPO | Attending: Emergency Medicine | Admitting: Emergency Medicine

## 2013-04-30 ENCOUNTER — Encounter (HOSPITAL_BASED_OUTPATIENT_CLINIC_OR_DEPARTMENT_OTHER): Payer: Self-pay | Admitting: Emergency Medicine

## 2013-04-30 DIAGNOSIS — J069 Acute upper respiratory infection, unspecified: Secondary | ICD-10-CM | POA: Insufficient documentation

## 2013-04-30 DIAGNOSIS — D649 Anemia, unspecified: Secondary | ICD-10-CM | POA: Insufficient documentation

## 2013-04-30 DIAGNOSIS — G43909 Migraine, unspecified, not intractable, without status migrainosus: Secondary | ICD-10-CM | POA: Insufficient documentation

## 2013-04-30 DIAGNOSIS — Z79899 Other long term (current) drug therapy: Secondary | ICD-10-CM | POA: Insufficient documentation

## 2013-04-30 DIAGNOSIS — Z791 Long term (current) use of non-steroidal anti-inflammatories (NSAID): Secondary | ICD-10-CM | POA: Insufficient documentation

## 2013-04-30 MED ORDER — ALBUTEROL SULFATE HFA 108 (90 BASE) MCG/ACT IN AERS: 2.0000 | INHALATION_SPRAY | RESPIRATORY_TRACT | Status: DC | PRN

## 2013-04-30 MED ORDER — KETOROLAC TROMETHAMINE 60 MG/2ML IM SOLN
60.0000 mg | Freq: Once | INTRAMUSCULAR | Status: AC
  Administered 2013-04-30: 60 mg via INTRAMUSCULAR
  Filled 2013-04-30: qty 2

## 2013-04-30 MED ORDER — TRAMADOL HCL 50 MG PO TABS: 50.0000 mg | ORAL_TABLET | Freq: Four times a day (QID) | ORAL | Status: DC | PRN

## 2013-04-30 MED ORDER — FLUTICASONE PROPIONATE 50 MCG/ACT NA SUSP: 1.0000 | Freq: Every day | NASAL | Status: DC

## 2013-04-30 MED ORDER — PREDNISONE 20 MG PO TABS: 40.0000 mg | ORAL_TABLET | Freq: Every day | ORAL | Status: DC

## 2013-04-30 MED ORDER — METOCLOPRAMIDE HCL 5 MG/ML IJ SOLN
10.0000 mg | Freq: Once | INTRAMUSCULAR | Status: AC
  Administered 2013-04-30: 10 mg via INTRAMUSCULAR
  Filled 2013-04-30: qty 2

## 2013-04-30 MED ORDER — DIPHENHYDRAMINE HCL 50 MG/ML IJ SOLN
25.0000 mg | Freq: Once | INTRAMUSCULAR | Status: AC
  Administered 2013-04-30: 25 mg via INTRAMUSCULAR
  Filled 2013-04-30: qty 1

## 2013-04-30 NOTE — Discharge Instructions (Signed)
Migraine Headache °A migraine headache is an intense, throbbing pain on one or both sides of your head. A migraine can last for 30 minutes to several hours. °CAUSES  °The exact cause of a migraine headache is not always known. However, a migraine may be caused when nerves in the brain become irritated and release chemicals that cause inflammation. This causes pain. °Certain things may also trigger migraines, such as: °· Alcohol. °· Smoking. °· Stress. °· Menstruation. °· Aged cheeses. °· Foods or drinks that contain nitrates, glutamate, aspartame, or tyramine. °· Lack of sleep. °· Chocolate. °· Caffeine. °· Hunger. °· Physical exertion. °· Fatigue. °· Medicines used to treat chest pain (nitroglycerine), birth control pills, estrogen, and some blood pressure medicines. °SIGNS AND SYMPTOMS °· Pain on one or both sides of your head. °· Pulsating or throbbing pain. °· Severe pain that prevents daily activities. °· Pain that is aggravated by any physical activity. °· Nausea, vomiting, or both. °· Dizziness. °· Pain with exposure to bright lights, loud noises, or activity. °· General sensitivity to bright lights, loud noises, or smells. °Before you get a migraine, you may get warning signs that a migraine is coming (aura). An aura may include: °· Seeing flashing lights. °· Seeing bright spots, halos, or zig-zag lines. °· Having tunnel vision or blurred vision. °· Having feelings of numbness or tingling. °· Having trouble talking. °· Having muscle weakness. °DIAGNOSIS  °A migraine headache is often diagnosed based on: °· Symptoms. °· Physical exam. °· A CT scan or MRI of your head. These imaging tests cannot diagnose migraines, but they can help rule out other causes of headaches. °TREATMENT °Medicines may be given for pain and nausea. Medicines can also be given to help prevent recurrent migraines.  °HOME CARE INSTRUCTIONS °· Only take over-the-counter or prescription medicines for pain or discomfort as directed by your  health care provider. The use of long-term narcotics is not recommended. °· Lie down in a dark, quiet room when you have a migraine. °· Keep a journal to find out what may trigger your migraine headaches. For example, write down: °· What you eat and drink. °· How much sleep you get. °· Any change to your diet or medicines. °· Limit alcohol consumption. °· Quit smoking if you smoke. °· Get 7 9 hours of sleep, or as recommended by your health care provider. °· Limit stress. °· Keep lights dim if bright lights bother you and make your migraines worse. °SEEK IMMEDIATE MEDICAL CARE IF:  °· Your migraine becomes severe. °· You have a fever. °· You have a stiff neck. °· You have vision loss. °· You have muscular weakness or loss of muscle control. °· You start losing your balance or have trouble walking. °· You feel faint or pass out. °· You have severe symptoms that are different from your first symptoms. °MAKE SURE YOU:  °· Understand these instructions. °· Will watch your condition. °· Will get help right away if you are not doing well or get worse. °Document Released: 12/21/2004 Document Revised: 10/11/2012 Document Reviewed: 08/28/2012 °ExitCare® Patient Information ©2014 [employer]. ° °Upper Respiratory Infection, Adult °An upper respiratory infection (URI) is also sometimes known as the common cold. The upper respiratory tract includes the nose, sinuses, throat, trachea, and bronchi. Bronchi are the airways leading to the lungs. Most people improve within 1 week, but symptoms can last up to 2 weeks. A residual cough may last even longer.  °CAUSES °Many different viruses can infect the tissues   lining the upper respiratory tract. The tissues become irritated and inflamed and often become very moist. Mucus production is also common. A cold is contagious. You can easily spread the virus to others by oral contact. This includes kissing, sharing a glass, coughing, or sneezing. Touching your mouth or nose and then  touching a surface, which is then touched by another person, can also spread the virus. °SYMPTOMS  °Symptoms typically develop 1 to 3 days after you come in contact with a cold virus. Symptoms vary from person to person. They may include: °· Runny nose. °· Sneezing. °· Nasal congestion. °· Sinus irritation. °· Sore throat. °· Loss of voice (laryngitis). °· Cough. °· Fatigue. °· Muscle aches. °· Loss of appetite. °· Headache. °· Low-grade fever. °DIAGNOSIS  °You might diagnose your own cold based on familiar symptoms, since most people get a cold 2 to 3 times a year. Your caregiver can confirm this based on your exam. Most importantly, your caregiver can check that your symptoms are not due to another disease such as strep throat, sinusitis, pneumonia, asthma, or epiglottitis. Blood tests, throat tests, and X-rays are not necessary to diagnose a common cold, but they may sometimes be helpful in excluding other more serious diseases. Your caregiver will decide if any further tests are required. °RISKS AND COMPLICATIONS  °You may be at risk for a more severe case of the common cold if you smoke cigarettes, have chronic heart disease (such as heart failure) or lung disease (such as asthma), or if you have a weakened immune system. The very young and very old are also at risk for more serious infections. Bacterial sinusitis, middle ear infections, and bacterial pneumonia can complicate the common cold. The common cold can worsen asthma and chronic obstructive pulmonary disease (COPD). Sometimes, these complications can require emergency medical care and may be life-threatening. °PREVENTION  °The best way to protect against getting a cold is to practice good hygiene. Avoid oral or hand contact with people with cold symptoms. Wash your hands often if contact occurs. There is no clear evidence that vitamin C, vitamin E, echinacea, or exercise reduces the chance of developing a cold. However, it is always recommended to get  plenty of rest and practice good nutrition. °TREATMENT  °Treatment is directed at relieving symptoms. There is no cure. Antibiotics are not effective, because the infection is caused by a virus, not by bacteria. Treatment may include: °· Increased fluid intake. Sports drinks offer valuable electrolytes, sugars, and fluids. °· Breathing heated mist or steam (vaporizer or shower). °· Eating chicken soup or other clear broths, and maintaining good nutrition. °· Getting plenty of rest. °· Using gargles or lozenges for comfort. °· Controlling fevers with ibuprofen or acetaminophen as directed by your caregiver. °· Increasing usage of your inhaler if you have asthma. °Zinc gel and zinc lozenges, taken in the first 24 hours of the common cold, can shorten the duration and lessen the severity of symptoms. Pain medicines may help with fever, muscle aches, and throat pain. A variety of non-prescription medicines are available to treat congestion and runny nose. Your caregiver can make recommendations and may suggest nasal or lung inhalers for other symptoms.  °HOME CARE INSTRUCTIONS  °· Only take over-the-counter or prescription medicines for pain, discomfort, or fever as directed by your caregiver. °· Use a warm mist humidifier or inhale steam from a shower to increase air moisture. This may keep secretions moist and make it easier to breathe. °· Drink enough water   and fluids to keep your urine clear or pale yellow. °· Rest as needed. °· Return to work when your temperature has returned to normal or as your caregiver advises. You may need to stay home longer to avoid infecting others. You can also use a face mask and careful hand washing to prevent spread of the virus. °SEEK MEDICAL CARE IF:  °· After the first few days, you feel you are getting worse rather than better. °· You need your caregiver's advice about medicines to control symptoms. °· You develop chills, worsening shortness of breath, or brown or red sputum. These  may be signs of pneumonia. °· You develop yellow or brown nasal discharge or pain in the face, especially when you bend forward. These may be signs of sinusitis. °· You develop a fever, swollen neck glands, pain with swallowing, or white areas in the back of your throat. These may be signs of strep throat. °SEEK IMMEDIATE MEDICAL CARE IF:  °· You have a fever. °· You develop severe or persistent headache, ear pain, sinus pain, or chest pain. °· You develop wheezing, a prolonged cough, cough up blood, or have a change in your usual mucus (if you have chronic lung disease). °· You develop sore muscles or a stiff neck. °Document Released: 06/16/2000 Document Revised: 03/15/2011 Document Reviewed: 04/24/2010 °ExitCare® Patient Information ©2014 [employer]. ° °

## 2013-04-30 NOTE — ED Notes (Signed)
Chest congestion, headache, and cough x 5 days. [facility] and given cough medication that made her feel strange. She took antibiotics.

## 2013-04-30 NOTE — ED Provider Notes (Signed)
CSN: [id number]     Arrival date & time 04/30/13  1710 History  This chart was scribed for Erin Creasehristopher J. Niquan Charnley, MD by Smiley HousemanFallon Davis, ED Scribe. The patient was seen in room MH10/MH10. Patient's care was started at 6:48 PM.  Chief Complaint  Patient presents with  . URI   The history is provided by the patient. No language interpreter was used.   HPI Comments: [name] is a 43 y.o. female who presents to the Emergency Department complaining of an URI that started about 1 week ago.  Pt has associated chest congestion, HA, productive cough, and sinus pressure.  Pt states her chest tightness has subsided, because her chest congestion is starting to loosen up.  Pt was seen at Urgent Care for same complaint and prescribed Bactrim and cough medicine.  Pt states they completed x-rays which showed normal results.  Pt states she had a strange reaction to the cough medicine, because of the hydrocodone.  She states her HA is transitioning from a sinus pressure HA to a migraine.  Pt states she has h/o migraines and takes Ultram to relieve them.    Past Medical History  Diagnosis Date  . Anemia     resolved  . Migraine    Past Surgical History  Procedure Laterality Date  . Cesarean section    . Tubal ligation    . Uterine ablation     Family History  Problem Relation Age of Onset  . Hypertension Mother   . Diabetes Mother   . Hypertension Father   . Heart attack Neg Hx   . Hyperlipidemia Neg Hx   . Sudden death Neg Hx    History  Substance Use Topics  . Smoking status: Never Smoker   . Smokeless tobacco: Not on file  . Alcohol Use: Yes     Comment: occasional   OB History   Grav Para Term Preterm Abortions TAB SAB Ect Mult Living                 Review of Systems  Constitutional: Negative for fever and chills.  HENT: Positive for congestion and sinus pressure. Negative for ear pain, rhinorrhea and sore throat.   Respiratory: Positive for cough. Negative for chest tightness,  shortness of breath and wheezing.   Cardiovascular: Negative for chest pain.  Gastrointestinal: Negative for nausea, vomiting, abdominal pain and diarrhea.  Skin: Negative for color change and rash.  Neurological: Positive for headaches.  Psychiatric/Behavioral: Negative for behavioral problems and confusion.  All other systems reviewed and are negative.  Allergies  Morphine and related  Home Medications   Prior to Admission medications   Medication Sig Start Date End Date Taking? Authorizing Provider  ferrous sulfate 325 (65 FE) MG tablet Take 325 mg by mouth daily with breakfast.      Historical Provider, MD  naproxen (NAPROSYN) 500 MG tablet Take 1 tablet (500 mg total) by mouth 2 (two) times daily. 03/21/13   Rodolph BongEvan S Corey, MD  traMADol (ULTRAM) 50 MG tablet Take 1 tablet (50 mg total) by mouth every 6 (six) hours as needed. 03/21/13   Rodolph BongEvan S Corey, MD   Triage Vitals: BP 106/61  Pulse 58  Temp(Src) 98.6 F (37 C) (Oral)  Resp 16  Ht 5\' 10"  (1.778 m)  Wt 180 lb (81.647 kg)  BMI 25.83 kg/m2  SpO2 100%  Physical Exam  Constitutional: She is oriented to person, place, and time. She appears well-developed and well-nourished. No distress.  HENT:  Head: Normocephalic and atraumatic.  Right Ear: Hearing, tympanic membrane, external ear and ear canal normal.  Left Ear: Hearing, tympanic membrane, external ear and ear canal normal.  Mouth/Throat: Uvula is midline, oropharynx is clear and moist and mucous membranes are normal.  Nasal congestion  Eyes: Conjunctivae and EOM are normal. Pupils are equal, round, and reactive to light.  Neck: Normal range of motion. Neck supple.  Cardiovascular: Regular rhythm, S1 normal and S2 normal.  Exam reveals no gallop and no friction rub.   No murmur heard. Pulmonary/Chest: Effort normal and breath sounds normal. No respiratory distress. She has no wheezes. She exhibits no tenderness.  Abdominal: Soft. Normal appearance and bowel sounds are  normal. There is no hepatosplenomegaly. There is no tenderness. There is no rebound, no guarding, no tenderness at McBurney's point and negative Murphy's sign. No hernia.  Musculoskeletal: Normal range of motion.  Neurological: She is alert and oriented to person, place, and time. She has normal strength. No cranial nerve deficit or sensory deficit. Coordination normal. GCS eye subscore is 4. GCS verbal subscore is 5. GCS motor subscore is 6.  Skin: Skin is warm, dry and intact. No rash noted. No cyanosis.  Psychiatric: She has a normal mood and affect. Her speech is normal and behavior is normal. Thought content normal.    ED Course  Procedures (including critical care time) DIAGNOSTIC STUDIES: Oxygen Saturation is 100% on RA, normal by my interpretation.    COORDINATION OF CARE: 6:54 PM-Will discharge with prednisone, Flonase, Ultram, and albuterol inhaler.  Patient informed of current plan of treatment and evaluation and agrees with plan.     MDM   Final diagnoses:  Migraine  URI (upper respiratory infection)   Patient with complaints of upper respiratory infection for more than a week. She was already treated with antibiotics. Her lungs are still clear, oxygenation 100%. No clinical concern for pneumonia. No need for any further antibiotic treatment. Patient's cough is improving, still having significant sinus congestion and pain. We'll treat with combination of oral prednisone and Flonase, Ultram as needed for pain.  She has a history of recurrent migraines. She is now experiencing pain across the front of her head that is consistent with previous migraines. He was slow in onset, no concerning features to warrant neuroimaging. She has normal neurologic function. Treated as migraine with Toradol, Reglan, Benadryl.  I personally performed the services described in this documentation, which was scribed in my presence. The recorded information has been reviewed and is  accurate.       Erin Creasehristopher J. Demarcus Thielke, MD 04/30/13 [phone]

## 2013-06-04 ENCOUNTER — Emergency Department (HOSPITAL_BASED_OUTPATIENT_CLINIC_OR_DEPARTMENT_OTHER)
Admission: EM | Admit: 2013-06-04 | Discharge: 2013-06-04 | Disposition: A | Discharge status: Home / Self Care | Payer: BC Managed Care – PPO | Attending: Emergency Medicine | Admitting: Emergency Medicine

## 2013-06-04 ENCOUNTER — Encounter (HOSPITAL_BASED_OUTPATIENT_CLINIC_OR_DEPARTMENT_OTHER): Payer: Self-pay | Admitting: Emergency Medicine

## 2013-06-04 ENCOUNTER — Emergency Department (HOSPITAL_BASED_OUTPATIENT_CLINIC_OR_DEPARTMENT_OTHER): Payer: BC Managed Care – PPO

## 2013-06-04 DIAGNOSIS — B9689 Other specified bacterial agents as the cause of diseases classified elsewhere: Secondary | ICD-10-CM | POA: Insufficient documentation

## 2013-06-04 DIAGNOSIS — A499 Bacterial infection, unspecified: Secondary | ICD-10-CM | POA: Insufficient documentation

## 2013-06-04 DIAGNOSIS — Z862 Personal history of diseases of the blood and blood-forming organs and certain disorders involving the immune mechanism: Secondary | ICD-10-CM | POA: Insufficient documentation

## 2013-06-04 DIAGNOSIS — N83209 Unspecified ovarian cyst, unspecified side: Secondary | ICD-10-CM | POA: Insufficient documentation

## 2013-06-04 DIAGNOSIS — Z79899 Other long term (current) drug therapy: Secondary | ICD-10-CM | POA: Insufficient documentation

## 2013-06-04 DIAGNOSIS — Z3202 Encounter for pregnancy test, result negative: Secondary | ICD-10-CM | POA: Insufficient documentation

## 2013-06-04 DIAGNOSIS — Z8679 Personal history of other diseases of the circulatory system: Secondary | ICD-10-CM | POA: Insufficient documentation

## 2013-06-04 DIAGNOSIS — Z87891 Personal history of nicotine dependence: Secondary | ICD-10-CM | POA: Insufficient documentation

## 2013-06-04 DIAGNOSIS — IMO0002 Reserved for concepts with insufficient information to code with codable children: Secondary | ICD-10-CM | POA: Insufficient documentation

## 2013-06-04 DIAGNOSIS — N760 Acute vaginitis: Secondary | ICD-10-CM | POA: Insufficient documentation

## 2013-06-04 DIAGNOSIS — Z791 Long term (current) use of non-steroidal anti-inflammatories (NSAID): Secondary | ICD-10-CM | POA: Insufficient documentation

## 2013-06-04 LAB — URINALYSIS, ROUTINE W REFLEX MICROSCOPIC
GLUCOSE, UA: NEGATIVE mg/dL
Ketones, ur: 15 mg/dL — AB
LEUKOCYTES UA: NEGATIVE
NITRITE: NEGATIVE
PH: 6.5 (ref 5.0–8.0)
PROTEIN: NEGATIVE mg/dL
Specific Gravity, Urine: 1.031 — ABNORMAL HIGH (ref 1.005–1.030)
Urobilinogen, UA: 1 mg/dL (ref 0.0–1.0)

## 2013-06-04 LAB — CBC WITH DIFFERENTIAL/PLATELET
BASOS ABS: 0 10*3/uL (ref 0.0–0.1)
Basophils Relative: 1 % (ref 0–1)
Eosinophils Absolute: 0 10*3/uL (ref 0.0–0.7)
Eosinophils Relative: 1 % (ref 0–5)
HCT: 39.5 % (ref 36.0–46.0)
Hemoglobin: 12.5 g/dL (ref 12.0–15.0)
LYMPHS PCT: 44 % (ref 12–46)
Lymphs Abs: 1.7 10*3/uL (ref 0.7–4.0)
MCH: 24.5 pg — ABNORMAL LOW (ref 26.0–34.0)
MCHC: 31.6 g/dL (ref 30.0–36.0)
MCV: 77.5 fL — ABNORMAL LOW (ref 78.0–100.0)
Monocytes Absolute: 0.5 10*3/uL (ref 0.1–1.0)
Monocytes Relative: 13 % — ABNORMAL HIGH (ref 3–12)
NEUTROS ABS: 1.6 10*3/uL — AB (ref 1.7–7.7)
NEUTROS PCT: 41 % — AB (ref 43–77)
PLATELETS: 249 10*3/uL (ref 150–400)
RBC: 5.1 MIL/uL (ref 3.87–5.11)
RDW: 14.3 % (ref 11.5–15.5)
WBC: 3.9 10*3/uL — AB (ref 4.0–10.5)

## 2013-06-04 LAB — WET PREP, GENITAL
Trich, Wet Prep: NONE SEEN
YEAST WET PREP: NONE SEEN

## 2013-06-04 LAB — URINE MICROSCOPIC-ADD ON

## 2013-06-04 LAB — BASIC METABOLIC PANEL
BUN: 8 mg/dL (ref 6–23)
CHLORIDE: 102 meq/L (ref 96–112)
CO2: 27 meq/L (ref 19–32)
Calcium: 9.7 mg/dL (ref 8.4–10.5)
Creatinine, Ser: 0.7 mg/dL (ref 0.50–1.10)
GFR calc non Af Amer: >90 mL/min (ref >90)
Glucose, Bld: 99 mg/dL (ref 70–99)
Potassium: 3.3 mEq/L — ABNORMAL LOW (ref 3.7–5.3)
SODIUM: 143 meq/L (ref 137–147)

## 2013-06-04 LAB — PREGNANCY, URINE: PREG TEST UR: NEGATIVE

## 2013-06-04 LAB — GC/CHLAMYDIA PROBE AMP
CT Probe RNA: NEGATIVE
GC Probe RNA: NEGATIVE

## 2013-06-04 MED ORDER — HYDROMORPHONE HCL PF 1 MG/ML IJ SOLN
1.0000 mg | Freq: Once | INTRAMUSCULAR | Status: AC
  Administered 2013-06-04: 1 mg via INTRAVENOUS
  Filled 2013-06-04: qty 1

## 2013-06-04 MED ORDER — TRAMADOL HCL 50 MG PO TABS: 100.0000 mg | ORAL_TABLET | Freq: Four times a day (QID) | ORAL | Status: DC | PRN

## 2013-06-04 MED ORDER — METRONIDAZOLE 500 MG PO TABS: 500.0000 mg | ORAL_TABLET | Freq: Two times a day (BID) | ORAL | Status: DC

## 2013-06-04 MED ORDER — ONDANSETRON HCL 4 MG/2ML IJ SOLN
4.0000 mg | Freq: Once | INTRAMUSCULAR | Status: AC
  Administered 2013-06-04: 4 mg via INTRAVENOUS
  Filled 2013-06-04: qty 2

## 2013-06-04 MED ORDER — OXYCODONE-ACETAMINOPHEN 5-325 MG PO TABS: 1.0000 | ORAL_TABLET | Freq: Four times a day (QID) | ORAL | Status: DC | PRN

## 2013-06-04 NOTE — ED Notes (Signed)
Pt amb to room 2 with quick steady gait in nad. Pt states that she had an ablation 2 years ago for heavy vag bleeding, has not had periods since then. Over the last week has had left sided pelvic pain with some spotting.

## 2013-06-04 NOTE — ED Notes (Signed)
V. Pickering, NP at bedside.  

## 2013-06-04 NOTE — ED Provider Notes (Signed)
CSN: [id number]     Arrival date & time 06/04/13  4098 History   First MD Initiated Contact with Patient 06/04/13 [id number]     Chief Complaint  Patient presents with  . Pelvic Pain     (Consider location/radiation/quality/duration/timing/severity/associated sxs/prior Treatment) HPI Comments: Pt states that she has had spotting over the last couple of days. Pt has an ablation 2 years ago and has no bleeding until the last couple of days. No vomiting, diarrhea, discharge or dysuria. Pt states that she is having llq pain that is worsening  Patient is a 43 y.o. female presenting with pelvic pain. The history is provided by the patient. No language interpreter was used.  Pelvic Pain This is a new problem. The current episode started in the past 7 days. The problem occurs intermittently. The problem has been gradually worsening. Associated symptoms include abdominal pain. Pertinent negatives include no fever or vomiting. Nothing aggravates the symptoms. She has tried nothing for the symptoms.    Past Medical History  Diagnosis Date  . Anemia     resolved  . Migraine    Past Surgical History  Procedure Laterality Date  . Cesarean section    . Tubal ligation    . Uterine ablation     Family History  Problem Relation Age of Onset  . Hypertension Mother   . Diabetes Mother   . Hypertension Father   . Heart attack Neg Hx   . Hyperlipidemia Neg Hx   . Sudden death Neg Hx    History  Substance Use Topics  . Smoking status: Former Games developer  . Smokeless tobacco: Not on file  . Alcohol Use: Yes     Comment: occasional   OB History   Grav Para Term Preterm Abortions TAB SAB Ect Mult Living                 Review of Systems  Constitutional: Negative for fever.  Respiratory: Negative.   Cardiovascular: Negative.   Gastrointestinal: Positive for abdominal pain. Negative for vomiting.  Genitourinary: Positive for pelvic pain.      Allergies  Morphine and related  Home Medications    Prior to Admission medications   Medication Sig Start Date End Date Taking? Authorizing Provider  albuterol (PROVENTIL HFA;VENTOLIN HFA) 108 (90 BASE) MCG/ACT inhaler Inhale 2 puffs into the lungs every 4 (four) hours as needed for wheezing or shortness of breath. 04/30/13   Gilda Crease, MD  ferrous sulfate 325 (65 FE) MG tablet Take 325 mg by mouth daily with breakfast.      Historical Provider, MD  fluticasone (FLONASE) 50 MCG/ACT nasal spray Place 1 spray into both nostrils daily. 04/30/13   Gilda Crease, MD  naproxen (NAPROSYN) 500 MG tablet Take 1 tablet (500 mg total) by mouth 2 (two) times daily. 03/21/13   Rodolph Bong, MD  predniSONE (DELTASONE) 20 MG tablet Take 2 tablets (40 mg total) by mouth daily with breakfast. 04/30/13   Gilda Crease, MD  traMADol (ULTRAM) 50 MG tablet Take 1 tablet (50 mg total) by mouth every 6 (six) hours as needed. 03/21/13   Rodolph Bong, MD  traMADol (ULTRAM) 50 MG tablet Take 1 tablet (50 mg total) by mouth every 6 (six) hours as needed. 04/30/13   Gilda Crease, MD   There were no vitals taken for this visit. Physical Exam  Nursing note and vitals reviewed. Constitutional: She is oriented to person, place, and time. She appears well-developed and  well-nourished.  Cardiovascular: Normal rate and regular rhythm.   Pulmonary/Chest: Effort normal and breath sounds normal.  Abdominal: Soft. Bowel sounds are normal.  llq tenderness  Genitourinary:  White discharge. Left adnexal tenderness. -cmt  Musculoskeletal: Normal range of motion.  Neurological: She is oriented to person, place, and time.  Skin: Skin is warm and dry.    ED Course  Procedures (including critical care time) Labs Review Labs Reviewed  WET PREP, GENITAL - Abnormal; Notable for the following:    Clue Cells Wet Prep HPF POC FEW (*)    WBC, Wet Prep HPF POC FEW (*)    All other components within normal limits  CBC WITH DIFFERENTIAL - Abnormal;  Notable for the following:    WBC 3.9 (*)    MCV 77.5 (*)    MCH 24.5 (*)    Neutrophils Relative % 41 (*)    Neutro Abs 1.6 (*)    Monocytes Relative 13 (*)    All other components within normal limits  BASIC METABOLIC PANEL - Abnormal; Notable for the following:    Potassium 3.3 (*)    All other components within normal limits  URINALYSIS, ROUTINE W REFLEX MICROSCOPIC - Abnormal; Notable for the following:    Specific Gravity, Urine 1.031 (*)    Hgb urine dipstick MODERATE (*)    Bilirubin Urine SMALL (*)    Ketones, ur 15 (*)    All other components within normal limits  URINE MICROSCOPIC-ADD ON - Abnormal; Notable for the following:    Squamous Epithelial / LPF FEW (*)    Bacteria, UA FEW (*)    Crystals CA OXALATE CRYSTALS (*)    All other components within normal limits  GC/CHLAMYDIA PROBE AMP  URINE CULTURE  PREGNANCY, URINE    Imaging Review US Transvaginal Non-ob  06/04/2013   CLINICAL DATA:  Left lower quadrant pain  EXAM: TRANSABDOMINAL AND TRANSVAGINAL ULTRASOUND OF PELVIS  TECHNIQUE: Both transabdominal and transvaginal ultrasound examinations of the pelvis were performed. Transabdominal technique was performed for global imaging of the pelvis including uterus, ovaries, adnexal regions, and pelvic cul-de-sac. It was necessary to proceed with endovaginal exam following the transabdominal exam to visualize the endometrium.  COMPARISON:  None  FINDINGS: Uterus  Measurements: 8.4 x 4.1 x 4.7 cm. No fibroids or other mass visualized.  Endometrium  Thickness: 5 mm. A few small areas of increased echogenicity are noted within the endometrium of uncertain significance.  Right ovary  Measurements: 2.1 x 1.4 x 1.8 cm. Normal appearance/no adnexal mass.  Left ovary  Measurements: 3.2 x 2.2 x 2.6 cm. A 1.9 cm septated hypoechoic lesion is noted likely representing multiple adjacent cysts  Other findings  No free fluid.  IMPRESSION: Septated hypoechoic structure in the left ovary likely  representing adjacent cysts given its appearance.  A few small areas of increased echogenicity are noted within the endometrium. These are of uncertain significance. It may be related to prior history of ablation.   Electronically Signed   By: Alcide Clever M.D.   On: 06/04/2013 10:43   US Pelvis Complete  06/04/2013   CLINICAL DATA:  Left lower quadrant pain  EXAM: TRANSABDOMINAL AND TRANSVAGINAL ULTRASOUND OF PELVIS  TECHNIQUE: Both transabdominal and transvaginal ultrasound examinations of the pelvis were performed. Transabdominal technique was performed for global imaging of the pelvis including uterus, ovaries, adnexal regions, and pelvic cul-de-sac. It was necessary to proceed with endovaginal exam following the transabdominal exam to visualize the endometrium.  COMPARISON:  None  FINDINGS: Uterus  Measurements: 8.4 x 4.1 x 4.7 cm. No fibroids or other mass visualized.  Endometrium  Thickness: 5 mm. A few small areas of increased echogenicity are noted within the endometrium of uncertain significance.  Right ovary  Measurements: 2.1 x 1.4 x 1.8 cm. Normal appearance/no adnexal mass.  Left ovary  Measurements: 3.2 x 2.2 x 2.6 cm. A 1.9 cm septated hypoechoic lesion is noted likely representing multiple adjacent cysts  Other findings  No free fluid.  IMPRESSION: Septated hypoechoic structure in the left ovary likely representing adjacent cysts given its appearance.  A few small areas of increased echogenicity are noted within the endometrium. These are of uncertain significance. It may be related to prior history of ablation.   Electronically Signed   By: Alcide CleverMark  Lukens M.D.   On: 06/04/2013 10:43     EKG Interpretation None      MDM   Final diagnoses:  BV (bacterial vaginosis)  Ovarian cyst    Discussed findings with pt. Pt is being seen by her ob next week in follow up. Will treat symptomatically with something for pain    Teressa LowerVrinda Krystle Oberman, NP 06/04/13 1619

## 2013-06-04 NOTE — Discharge Instructions (Signed)
Bacterial Vaginosis °Bacterial vaginosis is an infection of the vagina. It happens when too many of certain germs (bacteria) grow in the vagina. °HOME CARE °· Take your medicine as told by your doctor. °· Finish your medicine even if you start to feel better. °· Do not have sex until you finish your medicine and are better. °· Tell your sex partner that you have an infection. They should see their doctor for treatment. °· Practice safe sex. Use condoms. Have only one sex partner. °GET HELP IF: °· You are not getting better after 3 days of treatment. °· You have more grey fluid (discharge) coming from your vagina than before. °· You have more pain than before. °· You have a fever. °MAKE SURE YOU:  °· Understand these instructions. °· Will watch your condition. °· Will get help right away if you are not doing well or get worse. °Document Released: 09/30/2007 Document Revised: 10/11/2012 Document Reviewed: 08/02/2012 °ExitCare® Patient Information ©2014 [employer]. ° °Ovarian Cyst °An ovarian cyst is a sac filled with fluid or blood. This sac is attached to the ovary. Some cysts go away on their own. Other cysts need treatment.  °HOME CARE  °· Only take medicine as told by your doctor. °· Follow up with your doctor as told. °· Get regular pelvic exams and Pap tests. °GET HELP IF: °· Your periods are late, not regular, or painful. °· You stop having periods. °· Your belly (abdominal) or pelvic pain does not go away. °· Your belly becomes large or puffy (swollen). °· You have a hard time peeing (totally emptying your bladder). °· You have pressure on your bladder. °· You have pain during sex. °· You feel fullness, pressure, or discomfort in your belly. °· You lose weight for no reason. °· You feel sick most of the time. °· You have a hard time pooping (constipation). °· You do not feel like eating. °· You develop pimples (acne). °· You have an increase in hair on your body and face. °· You are gaining weight for no  reason. °· You think you are pregnant. °GET HELP RIGHT AWAY IF:  °· Your belly pain gets worse. °· You feel sick to your stomach (nauseous), and you throw up (vomit). °· You have a fever that comes on fast. °· You have belly pain while pooping (bowel movement). °· Your periods are heavier than usual. °MAKE SURE YOU:  °· Understand these instructions. °· Will watch your condition. °· Will get help right away if you are not doing well or get worse. °Document Released: 06/09/2007 Document Revised: 10/11/2012 Document Reviewed: 08/28/2012 °ExitCare® Patient Information ©2014 [employer]. ° °

## 2013-06-05 LAB — URINE CULTURE
COLONY COUNT: NO GROWTH
Culture: NO GROWTH

## 2013-06-07 NOTE — ED Provider Notes (Signed)
Medical screening examination/treatment/procedure(s) were performed by non-physician practitioner and as supervising physician I was immediately available for consultation/collaboration.   EKG Interpretation None        Candyce Churn III, MD 06/07/13 1112

## 2013-06-09 ENCOUNTER — Encounter (HOSPITAL_BASED_OUTPATIENT_CLINIC_OR_DEPARTMENT_OTHER): Payer: Self-pay | Admitting: Emergency Medicine

## 2013-06-09 ENCOUNTER — Emergency Department (HOSPITAL_BASED_OUTPATIENT_CLINIC_OR_DEPARTMENT_OTHER)
Admission: EM | Admit: 2013-06-09 | Discharge: 2013-06-09 | Disposition: A | Discharge status: Home / Self Care | Payer: BC Managed Care – PPO | Attending: Emergency Medicine | Admitting: Emergency Medicine

## 2013-06-09 DIAGNOSIS — Z87891 Personal history of nicotine dependence: Secondary | ICD-10-CM | POA: Insufficient documentation

## 2013-06-09 DIAGNOSIS — Z8679 Personal history of other diseases of the circulatory system: Secondary | ICD-10-CM | POA: Insufficient documentation

## 2013-06-09 DIAGNOSIS — Z862 Personal history of diseases of the blood and blood-forming organs and certain disorders involving the immune mechanism: Secondary | ICD-10-CM | POA: Insufficient documentation

## 2013-06-09 DIAGNOSIS — IMO0002 Reserved for concepts with insufficient information to code with codable children: Secondary | ICD-10-CM | POA: Insufficient documentation

## 2013-06-09 DIAGNOSIS — Z792 Long term (current) use of antibiotics: Secondary | ICD-10-CM | POA: Insufficient documentation

## 2013-06-09 DIAGNOSIS — N83209 Unspecified ovarian cyst, unspecified side: Secondary | ICD-10-CM | POA: Insufficient documentation

## 2013-06-09 DIAGNOSIS — Z79899 Other long term (current) drug therapy: Secondary | ICD-10-CM | POA: Insufficient documentation

## 2013-06-09 DIAGNOSIS — Z3202 Encounter for pregnancy test, result negative: Secondary | ICD-10-CM | POA: Insufficient documentation

## 2013-06-09 DIAGNOSIS — N83202 Unspecified ovarian cyst, left side: Secondary | ICD-10-CM

## 2013-06-09 LAB — URINALYSIS, ROUTINE W REFLEX MICROSCOPIC
Glucose, UA: NEGATIVE mg/dL
Hgb urine dipstick: NEGATIVE
Ketones, ur: NEGATIVE mg/dL
LEUKOCYTES UA: NEGATIVE
NITRITE: NEGATIVE
PH: 7.5 (ref 5.0–8.0)
Protein, ur: NEGATIVE mg/dL
SPECIFIC GRAVITY, URINE: 1.026 (ref 1.005–1.030)
UROBILINOGEN UA: 1 mg/dL (ref 0.0–1.0)

## 2013-06-09 LAB — PREGNANCY, URINE: Preg Test, Ur: NEGATIVE

## 2013-06-09 MED ORDER — OXYCODONE-ACETAMINOPHEN 5-325 MG PO TABS: 1.0000 | ORAL_TABLET | Freq: Four times a day (QID) | ORAL | Status: DC | PRN

## 2013-06-09 MED ORDER — OXYCODONE-ACETAMINOPHEN 5-325 MG PO TABS
2.0000 | ORAL_TABLET | Freq: Once | ORAL | Status: AC
  Administered 2013-06-09: 2 via ORAL
  Filled 2013-06-09: qty 2

## 2013-06-09 NOTE — ED Provider Notes (Signed)
CSN: [id number]     Arrival date & time 06/09/13  0940 History   First MD Initiated Contact with Patient 06/09/13 [phone]     Chief Complaint  Patient presents with  . Abdominal Pain     (Consider location/radiation/quality/duration/timing/severity/associated sxs/prior Treatment) Patient is a 43 y.o. female presenting with abdominal pain. The history is provided by the patient. No language interpreter was used.  Abdominal Pain Pain location:  LLQ Pain quality: aching   Pain radiates to:  Does not radiate Pain severity:  Moderate Onset quality:  Unable to specify Duration:  1 week Timing:  Constant Progression:  Waxing and waning Chronicity:  New Context: not diet changes, not sick contacts, not suspicious food intake and not trauma   Relieved by:  Nothing Worsened by:  Nothing tried Ineffective treatments: Toradol. Associated symptoms: no anorexia, no chest pain, no chills, no constipation, no cough, no diarrhea, no dysuria, no fatigue, no fever, no nausea, no shortness of breath, no sore throat and no vomiting   Risk factors: not elderly, has not had multiple surgeries, no NSAID use, not obese, not pregnant and no recent hospitalization     Past Medical History  Diagnosis Date  . Anemia     resolved  . Migraine    Past Surgical History  Procedure Laterality Date  . Cesarean section    . Tubal ligation    . Uterine ablation     Family History  Problem Relation Age of Onset  . Hypertension Mother   . Diabetes Mother   . Hypertension Father   . Heart attack Neg Hx   . Hyperlipidemia Neg Hx   . Sudden death Neg Hx    History  Substance Use Topics  . Smoking status: Former Games developermoker  . Smokeless tobacco: Not on file  . Alcohol Use: Yes     Comment: occasional   OB History   Grav Para Term Preterm Abortions TAB SAB Ect Mult Living                 Review of Systems  Constitutional: Negative for fever, chills, diaphoresis, activity change, appetite change and fatigue.   HENT: Negative for congestion, facial swelling, rhinorrhea and sore throat.   Eyes: Negative for photophobia and discharge.  Respiratory: Negative for cough, chest tightness and shortness of breath.   Cardiovascular: Negative for chest pain, palpitations and leg swelling.  Gastrointestinal: Positive for abdominal pain. Negative for nausea, vomiting, diarrhea, constipation and anorexia.  Endocrine: Negative for polydipsia and polyuria.  Genitourinary: Negative for dysuria, frequency, difficulty urinating and pelvic pain.  Musculoskeletal: Negative for arthralgias, back pain, neck pain and neck stiffness.  Skin: Negative for color change and wound.  Allergic/Immunologic: Negative for immunocompromised state.  Neurological: Negative for facial asymmetry, weakness, numbness and headaches.  Hematological: Does not bruise/bleed easily.  Psychiatric/Behavioral: Negative for confusion and agitation.      Allergies  Morphine and related  Home Medications   Prior to Admission medications   Medication Sig Start Date End Date Taking? Authorizing Provider  albuterol (PROVENTIL HFA;VENTOLIN HFA) 108 (90 BASE) MCG/ACT inhaler Inhale 2 puffs into the lungs every 4 (four) hours as needed for wheezing or shortness of breath. 04/30/13   Gilda Creasehristopher J. Pollina, MD  ferrous sulfate 325 (65 FE) MG tablet Take 325 mg by mouth daily with breakfast.      Historical Provider, MD  fluticasone (FLONASE) 50 MCG/ACT nasal spray Place 1 spray into both nostrils daily. 04/30/13   Gilda Creasehristopher J. Pollina,  MD  metroNIDAZOLE (FLAGYL) 500 MG tablet Take 1 tablet (500 mg total) by mouth 2 (two) times daily. 06/04/13   Teressa Lower, NP  naproxen (NAPROSYN) 500 MG tablet Take 1 tablet (500 mg total) by mouth 2 (two) times daily. 03/21/13   Rodolph Bong, MD  oxyCODONE-acetaminophen (PERCOCET/ROXICET) 5-325 MG per tablet Take 1 tablet by mouth every 6 (six) hours as needed for moderate pain or severe pain. 06/09/13   Shanna Cisco, MD  predniSONE (DELTASONE) 20 MG tablet Take 2 tablets (40 mg total) by mouth daily with breakfast. 04/30/13   Gilda Crease, MD  traMADol (ULTRAM) 50 MG tablet Take 1 tablet (50 mg total) by mouth every 6 (six) hours as needed. 03/21/13   Rodolph Bong, MD  traMADol (ULTRAM) 50 MG tablet Take 1 tablet (50 mg total) by mouth every 6 (six) hours as needed. 04/30/13   Gilda Crease, MD  traMADol (ULTRAM) 50 MG tablet Take 2 tablets (100 mg total) by mouth every 6 (six) hours as needed. 06/04/13   Teressa Lower, NP   BP 106/64  Pulse 72  Temp(Src) 98.9 F (37.2 C) (Oral)  Resp 18  Ht 5\' 9"  (1.753 m)  Wt 168 lb (76.204 kg)  BMI 24.80 kg/m2  SpO2 100% Physical Exam  Constitutional: She is oriented to person, place, and time. She appears well-developed and well-nourished. No distress.  HENT:  Head: Normocephalic and atraumatic.  Mouth/Throat: No oropharyngeal exudate.  Eyes: Pupils are equal, round, and reactive to light.  Neck: Normal range of motion. Neck supple.  Cardiovascular: Normal rate, regular rhythm and normal heart sounds.  Exam reveals no gallop and no friction rub.   No murmur heard. Pulmonary/Chest: Effort normal and breath sounds normal. No respiratory distress. She has no wheezes. She has no rales.  Abdominal: Soft. Bowel sounds are normal. She exhibits no distension and no mass. There is tenderness in the left lower quadrant. There is no rigidity, no rebound, no guarding and no CVA tenderness.  Musculoskeletal: Normal range of motion. She exhibits no edema and no tenderness.  Neurological: She is alert and oriented to person, place, and time.  Skin: Skin is warm and dry.  Psychiatric: She has a normal mood and affect.    ED Course  Procedures (including critical care time) Labs Review Labs Reviewed  URINALYSIS, ROUTINE W REFLEX MICROSCOPIC - Abnormal; Notable for the following:    Bilirubin Urine SMALL (*)    All other components within normal  limits  PREGNANCY, URINE    Imaging Review No results found.   EKG Interpretation None      MDM   Final diagnoses:  Left ovarian cyst    Pt is a 43 y.o. female with Pmhx as above who presents with continued LLQ pelvic pain for about 1 week. Pt seen 5 days ago for same, had labs, urine, pelvic exam, pelvic US which showed L ovarian cyst. Toradol given for pain which pt says helped, but was not strong enough. On PE, VSS, pt in NAD. +TTP LLQ w/o rebound or guarding. Given symptoms unchanged, do not feel she needs repeat pelvic exam or [place].  Will recheck urine, given trial of percocet in ED.   11:00AM Pt feeling much better. Will d/c home. Return precautions given for new or worsening symptoms including worsening pain, fever, pain w/ urination, defecation, blood in stool, inabiliity to tolerate liquids. She will f/u with her GYN as scheduled in 6 days.  Shanna Cisco, MD 06/09/13 1101

## 2013-06-09 NOTE — Progress Notes (Signed)
ED CM received incoming call from [facility] verifying ED discharge prescription. Prescription verified. No further CM needs identiified

## 2013-06-09 NOTE — ED Notes (Signed)
Patient stated her husband dropped her off and would be picking her up to go home.

## 2013-06-09 NOTE — ED Notes (Signed)
Patient was here this week and dx with ovarian cyst. She states that she does have an appt with her ob/gyn next week. Was offered percocet, which she declined at discharge last week, but states that she is still having severe pain and states she needs something stronger (was given ultram at last ER visit)

## 2013-06-09 NOTE — Discharge Instructions (Signed)

## 2013-07-01 ENCOUNTER — Emergency Department (INDEPENDENT_AMBULATORY_CARE_PROVIDER_SITE_OTHER)
Admission: EM | Admit: 2013-07-01 | Discharge: 2013-07-01 | Disposition: A | Discharge status: Home / Self Care | Payer: BC Managed Care – PPO | Source: Home / Self Care | Attending: Emergency Medicine | Admitting: Emergency Medicine

## 2013-07-01 ENCOUNTER — Encounter (HOSPITAL_COMMUNITY): Payer: Self-pay | Admitting: Emergency Medicine

## 2013-07-01 DIAGNOSIS — K047 Periapical abscess without sinus: Secondary | ICD-10-CM

## 2013-07-01 MED ORDER — CLINDAMYCIN HCL 300 MG PO CAPS: 300.0000 mg | ORAL_CAPSULE | Freq: Four times a day (QID) | ORAL | Status: DC

## 2013-07-01 MED ORDER — TRAMADOL HCL 50 MG PO TABS: 50.0000 mg | ORAL_TABLET | Freq: Four times a day (QID) | ORAL | Status: DC | PRN

## 2013-07-01 NOTE — Discharge Instructions (Signed)

## 2013-07-01 NOTE — ED Notes (Signed)
Left facial swelling and pain.  Onset 3 days ago.

## 2013-07-01 NOTE — ED Provider Notes (Signed)
CSN: [id number]     Arrival date & time 07/01/13  0913 History   First MD Initiated Contact with Patient 07/01/13 [phone]     Chief Complaint  Patient presents with  . Facial Swelling   (Consider location/radiation/quality/duration/timing/severity/associated sxs/prior Treatment) HPI Comments: Patient reports she has generally poor dentition and is working toward having all of her teeth extracted. Developed progressive painful left facial swelling with Tmax of 100.0 over past 3 days. Reports tenderness along gumline of left upper teeth  The history is provided by the patient.    Past Medical History  Diagnosis Date  . Anemia     resolved  . Migraine    Past Surgical History  Procedure Laterality Date  . Cesarean section    . Tubal ligation    . Uterine ablation     Family History  Problem Relation Age of Onset  . Hypertension Mother   . Diabetes Mother   . Hypertension Father   . Heart attack Neg Hx   . Hyperlipidemia Neg Hx   . Sudden death Neg Hx    History  Substance Use Topics  . Smoking status: Former Games developermoker  . Smokeless tobacco: Not on file  . Alcohol Use: Yes     Comment: occasional   OB History   Grav Para Term Preterm Abortions TAB SAB Ect Mult Living                 Review of Systems  All other systems reviewed and are negative.   Allergies  Morphine and related  Home Medications   Prior to Admission medications   Medication Sig Start Date End Date Taking? Authorizing Serita Degroote  traMADol (ULTRAM) 50 MG tablet Take 2 tablets (100 mg total) by mouth every 6 (six) hours as needed. 06/04/13  Yes Teressa LowerVrinda Pickering, NP  albuterol (PROVENTIL HFA;VENTOLIN HFA) 108 (90 BASE) MCG/ACT inhaler Inhale 2 puffs into the lungs every 4 (four) hours as needed for wheezing or shortness of breath. 04/30/13   Gilda Creasehristopher J. Pollina, MD  clindamycin (CLEOCIN) 300 MG capsule Take 1 capsule (300 mg total) by mouth 4 (four) times daily. X 7 days 07/01/13   Ardis RowanJennifer Lee Presson, PA   ferrous sulfate 325 (65 FE) MG tablet Take 325 mg by mouth daily with breakfast.      Historical Virgil Lightner, MD  fluticasone (FLONASE) 50 MCG/ACT nasal spray Place 1 spray into both nostrils daily. 04/30/13   Gilda Creasehristopher J. Pollina, MD  metroNIDAZOLE (FLAGYL) 500 MG tablet Take 1 tablet (500 mg total) by mouth 2 (two) times daily. 06/04/13   Teressa LowerVrinda Pickering, NP  naproxen (NAPROSYN) 500 MG tablet Take 1 tablet (500 mg total) by mouth 2 (two) times daily. 03/21/13   Rodolph BongEvan S Corey, MD  oxyCODONE-acetaminophen (PERCOCET/ROXICET) 5-325 MG per tablet Take 1 tablet by mouth every 6 (six) hours as needed for moderate pain or severe pain. 06/09/13   Shanna CiscoMegan E Docherty, MD  predniSONE (DELTASONE) 20 MG tablet Take 2 tablets (40 mg total) by mouth daily with breakfast. 04/30/13   Gilda Creasehristopher J. Pollina, MD  traMADol (ULTRAM) 50 MG tablet Take 1 tablet (50 mg total) by mouth every 6 (six) hours as needed. 03/21/13   Rodolph BongEvan S Corey, MD  traMADol (ULTRAM) 50 MG tablet Take 1 tablet (50 mg total) by mouth every 6 (six) hours as needed. 04/30/13   Gilda Creasehristopher J. Pollina, MD  traMADol (ULTRAM) 50 MG tablet Take 1 tablet (50 mg total) by mouth every 6 (six) hours as needed  for moderate pain or severe pain. 07/01/13   Jess BartersJennifer Lee Presson, PA   BP 115/65  Pulse 57  Temp(Src) 99.3 F (37.4 C) (Oral)  Resp 18  SpO2 100% Physical Exam  Constitutional: She is oriented to person, place, and time. She appears well-developed and well-nourished. No distress.  HENT:  Head: Normocephalic and atraumatic.  Left Ear: Hearing, tympanic membrane, external ear and ear canal normal.  Nose: Nose normal.  Widespread dental decay with majority of left and right upper molars decayed to level of gumline. Left upper gumline erythematous ans swollen with tenderness and facial STS over left left maxilla  Eyes: Conjunctivae are normal. No scleral icterus.  Neck: Normal range of motion. Neck supple.  Cardiovascular: Normal rate.   Pulmonary/Chest:  Effort normal.  Musculoskeletal: Normal range of motion.  Neurological: She is alert and oriented to person, place, and time.  Skin: Skin is warm and dry.  Psychiatric: She has a normal mood and affect. Her behavior is normal.    ED Course  Procedures (including critical care time) Labs Review Labs Reviewed - No data to display  Imaging Review No results found.   MDM   1. Periapical abscess    Given poor dentition, patient likely with left maxillary periapical abscess. Will begin treatment with oral clindamycin and advise close follow up with her dentist.    Ardis RowanJennifer Lee Presson, PA 07/01/13 1008

## 2013-07-03 NOTE — ED Provider Notes (Signed)
Medical screening examination/treatment/procedure(s) were performed by non-physician practitioner and as supervising physician I was immediately available for consultation/collaboration.  David Keller, M.D.  David C Keller, MD 07/03/13 1626 

## 2013-07-12 ENCOUNTER — Encounter (HOSPITAL_COMMUNITY): Payer: Self-pay | Admitting: Emergency Medicine

## 2013-07-12 ENCOUNTER — Emergency Department (INDEPENDENT_AMBULATORY_CARE_PROVIDER_SITE_OTHER)
Admission: EM | Admit: 2013-07-12 | Discharge: 2013-07-12 | Disposition: A | Discharge status: Home / Self Care | Payer: BC Managed Care – PPO | Source: Home / Self Care | Attending: Emergency Medicine | Admitting: Emergency Medicine

## 2013-07-12 DIAGNOSIS — K047 Periapical abscess without sinus: Secondary | ICD-10-CM

## 2013-07-12 DIAGNOSIS — K029 Dental caries, unspecified: Secondary | ICD-10-CM

## 2013-07-12 MED ORDER — AMOXICILLIN-POT CLAVULANATE 875-125 MG PO TABS: 1.0000 | ORAL_TABLET | Freq: Two times a day (BID) | ORAL | Status: DC

## 2013-07-12 MED ORDER — TRAMADOL HCL 50 MG PO TABS: 50.0000 mg | ORAL_TABLET | Freq: Four times a day (QID) | ORAL | Status: DC | PRN

## 2013-07-12 NOTE — ED Provider Notes (Signed)
CSN: [id number]     Arrival date & time 07/12/13  1137 History   First MD Initiated Contact with Patient 07/12/13 1400     Chief Complaint  Patient presents with  . Dental Problem   (Consider location/radiation/quality/duration/timing/severity/associated sxs/prior Treatment) HPI Comments: 43 year old female with history of severe dental caries presents complaining of left-sided facial swelling and dental pain. She was seen here about 2 weeks ago and was prescribed clindamycin for this, however she did not tolerate the clindamycin well. She has made an appointment with an oral surgeon, she has that appointment in one week. She is here requesting a different antibiotic and a refill of the Ultram if possible. Denies any systemic symptoms.   Past Medical History  Diagnosis Date  . Anemia     resolved  . Migraine    Past Surgical History  Procedure Laterality Date  . Cesarean section    . Tubal ligation    . Uterine ablation     Family History  Problem Relation Age of Onset  . Hypertension Mother   . Diabetes Mother   . Hypertension Father   . Heart attack Neg Hx   . Hyperlipidemia Neg Hx   . Sudden death Neg Hx    History  Substance Use Topics  . Smoking status: Former Games developermoker  . Smokeless tobacco: Not on file  . Alcohol Use: Yes     Comment: occasional   OB History   Grav Para Term Preterm Abortions TAB SAB Ect Mult Living                 Review of Systems  HENT: Positive for dental problem and facial swelling.   All other systems reviewed and are negative.   Allergies  Morphine and related  Home Medications   Prior to Admission medications   Medication Sig Start Date End Date Taking? Authorizing Provider  albuterol (PROVENTIL HFA;VENTOLIN HFA) 108 (90 BASE) MCG/ACT inhaler Inhale 2 puffs into the lungs every 4 (four) hours as needed for wheezing or shortness of breath. 04/30/13   Gilda Creasehristopher J. Pollina, MD  amoxicillin-clavulanate (AUGMENTIN) 875-125 MG per tablet  Take 1 tablet by mouth every 12 (twelve) hours. 07/12/13   Adrian BlackwaterZachary H Kaikoa Magro, PA-C  clindamycin (CLEOCIN) 300 MG capsule Take 1 capsule (300 mg total) by mouth 4 (four) times daily. X 7 days 07/01/13   Ardis RowanJennifer Lee Presson, PA  ferrous sulfate 325 (65 FE) MG tablet Take 325 mg by mouth daily with breakfast.      Historical Provider, MD  fluticasone (FLONASE) 50 MCG/ACT nasal spray Place 1 spray into both nostrils daily. 04/30/13   Gilda Creasehristopher J. Pollina, MD  metroNIDAZOLE (FLAGYL) 500 MG tablet Take 1 tablet (500 mg total) by mouth 2 (two) times daily. 06/04/13   Teressa LowerVrinda Pickering, NP  naproxen (NAPROSYN) 500 MG tablet Take 1 tablet (500 mg total) by mouth 2 (two) times daily. 03/21/13   Rodolph BongEvan S Corey, MD  oxyCODONE-acetaminophen (PERCOCET/ROXICET) 5-325 MG per tablet Take 1 tablet by mouth every 6 (six) hours as needed for moderate pain or severe pain. 06/09/13   Shanna CiscoMegan E Docherty, MD  predniSONE (DELTASONE) 20 MG tablet Take 2 tablets (40 mg total) by mouth daily with breakfast. 04/30/13   Gilda Creasehristopher J. Pollina, MD  traMADol (ULTRAM) 50 MG tablet Take 1 tablet (50 mg total) by mouth every 6 (six) hours as needed. 03/21/13   Rodolph BongEvan S Corey, MD  traMADol (ULTRAM) 50 MG tablet Take 1 tablet (50 mg total) by mouth  every 6 (six) hours as needed. 04/30/13   Gilda Crease, MD  traMADol (ULTRAM) 50 MG tablet Take 2 tablets (100 mg total) by mouth every 6 (six) hours as needed. 06/04/13   Teressa Lower, NP  traMADol (ULTRAM) 50 MG tablet Take 1 tablet (50 mg total) by mouth every 6 (six) hours as needed for moderate pain or severe pain. 07/01/13   Ardis Rowan, PA  traMADol (ULTRAM) 50 MG tablet Take 1 tablet (50 mg total) by mouth every 6 (six) hours as needed. 07/12/13   Adrian Blackwater Azariya Freeman, PA-C   BP 109/70  Pulse 80  Temp(Src) 99.1 F (37.3 C) (Oral)  Resp 16  SpO2 100% Physical Exam  Nursing note and vitals reviewed. Constitutional: She is oriented to person, place, and time. Vital signs are normal. She  appears well-developed and well-nourished. No distress.  HENT:  Head: Normocephalic and atraumatic.    Mouth/Throat: Oropharynx is clear and moist and mucous membranes are normal. No trismus in the jaw. Dental caries (severe, with the majority of the teeth broken off at the gumline.the left upper jaw gums are erythematous and tender) present.  Pulmonary/Chest: Effort normal. No respiratory distress.  Neurological: She is alert and oriented to person, place, and time. She has normal strength. Coordination normal.  Skin: Skin is warm and dry. No rash noted. She is not diaphoretic.  Psychiatric: She has a normal mood and affect. Judgment normal.    ED Course  Procedures (including critical care time) Labs Review Labs Reviewed - No data to display  Imaging Review No results found.   MDM   1. Dental caries   2. Dental abscess    Treat with Augmentin, refill tramadol. Followup with the oral surgeon   Meds ordered this encounter  Medications  . amoxicillin-clavulanate (AUGMENTIN) 875-125 MG per tablet    Sig: Take 1 tablet by mouth every 12 (twelve) hours.    Dispense:  20 tablet    Refill:  0    Order Specific Question:  Supervising Provider    Answer:  Lorenz Coaster, DAVID C [id number]  . traMADol (ULTRAM) 50 MG tablet    Sig: Take 1 tablet (50 mg total) by mouth every 6 (six) hours as needed.    Dispense:  15 tablet    Refill:  0    Order Specific Question:  Supervising Provider    Answer:  Lorenz Coaster, DAVID C [[id number]]       Graylon Good, PA-C 07/12/13 1415

## 2013-07-12 NOTE — ED Notes (Signed)
Pt  Was   Seen    10  Days   Ago  For  Dental  Infection  Was  rx  CLINDAMYCIN   SHE  ONLY  TOOK  2  DAYS  OF THE  MEDS        - she     Reports  It  Was  Giving  Her  Gi  Problems   She      Reports  Has  An appyt  With  A  Dentist  In  1  Week  She  Reports  Pain swelling l  Side  Face

## 2013-07-12 NOTE — Discharge Instructions (Signed)
Dental Caries  Dental caries (also called tooth decay) is the most common oral disease. It can occur at any age, but is more common in children and young adults.  HOW DENTAL CARIES DEVELOPS  The process of decay begins when bacteria and foods (particularly sugars and starches) combine in your mouth to produce plaque. Plaque is a substance that sticks to the hard, outer surface of a tooth (enamel). The bacteria in plaque produce acids that attack enamel. These acids may also attack the root surface of a tooth (cementum) if it is exposed. Repeated attacks dissolve these surfaces and create holes in the tooth (cavities). If left untreated, the acids destroy the other layers of the tooth.  RISK FACTORS  Frequent sipping of sugary beverages.   Frequent snacking on sugary and starchy foods, especially those that easily get stuck in the teeth.   Poor oral hygiene.   Dry mouth.   Substance abuse such as methamphetamine abuse.   Broken or poor-fitting dental restorations.   Eating disorders.   Gastroesophageal reflux disease (GERD).   Certain radiation treatments to the head and neck. SYMPTOMS In the early stages of dental caries, symptoms are seldom present. Sometimes white, chalky areas may be seen on the enamel or other tooth layers. In later stages, symptoms may include:  Pits and holes on the enamel.  Toothache after sweet, hot, or cold foods or drinks are consumed.  Pain around the tooth.  Swelling around the tooth. DIAGNOSIS  Most of the time, dental caries is detected during a regular dental checkup. A diagnosis is made after a thorough medical and dental history is taken and the surfaces of your teeth are checked for signs of dental caries. Sometimes special instruments, such as lasers, are used to check for dental caries. Dental X-ray exams may be taken so that areas not visible to the eye (such as between the contact areas of the teeth) can be checked for cavities.    TREATMENT  If dental caries is in its early stages, it may be reversed with a fluoride treatment or an application of a remineralizing agent at the dental office. Thorough brushing and flossing at home is needed to aid these treatments. If it is in its later stages, treatment depends on the location and extent of tooth destruction:   If a small area of the tooth has been destroyed, the destroyed area will be removed and cavities will be filled with a material such as gold, silver amalgam, or composite resin.   If a large area of the tooth has been destroyed, the destroyed area will be removed and a cap (crown) will be fitted over the remaining tooth structure.   If the center part of the tooth (pulp) is affected, a procedure called a root canal will be needed before a filling or crown can be placed.   If most of the tooth has been destroyed, the tooth may need to be pulled (extracted). HOME CARE INSTRUCTIONS You can prevent, stop, or reverse dental caries at home by practicing good oral hygiene. Good oral hygiene includes:  Thoroughly cleaning your teeth at least twice a day with a toothbrush and dental floss.   Using a fluoride toothpaste. A fluoride mouth rinse may also be used if recommended by your dentist or health care provider.   Restricting the amount of sugary and starchy foods and sugary liquids you consume.   Avoiding frequent snacking on these foods and sipping of these liquids.   Keeping regular visits  with a dentist for checkups and cleanings. PREVENTION   Practice good oral hygiene.  Consider a dental sealant. A dental sealant is a coating material that is applied by your dentist to the pits and grooves of teeth. The sealant prevents food from being trapped in them. It may protect the teeth for several years.  Ask about fluoride supplements if you live in a community without fluorinated water or with water that has a low fluoride content. Use fluoride supplements  as directed by your dentist or health care provider.  Allow fluoride varnish applications to teeth if directed by your dentist or health care provider. Document Released: 09/12/2001 Document Revised: 08/23/2012 Document Reviewed: 12/24/2011 [facility] Patient Information 2015 [place], [place]. This information is not intended to replace advice given to you by your health care provider. Make sure you discuss any questions you have with your health care provider.  Dental Abscess A dental abscess is a collection of infected fluid (pus) from a bacterial infection in the inner part of the tooth (pulp). It usually occurs at the end of the tooth's root.  CAUSES   Severe tooth decay.  Trauma to the tooth that allows bacteria to enter into the pulp, such as a broken or chipped tooth. SYMPTOMS   Severe pain in and around the infected tooth.  Swelling and redness around the abscessed tooth or in the mouth or face.  Tenderness.  Pus drainage.  Bad breath.  Bitter taste in the mouth.  Difficulty swallowing.  Difficulty opening the mouth.  Nausea.  Vomiting.  Chills.  Swollen neck glands. DIAGNOSIS   A medical and dental history will be taken.  An examination will be performed by tapping on the abscessed tooth.  X-rays may be taken of the tooth to identify the abscess. TREATMENT The goal of treatment is to eliminate the infection. You may be prescribed antibiotic medicine to stop the infection from spreading. A root canal may be performed to save the tooth. If the tooth cannot be saved, it may be pulled (extracted) and the abscess may be drained.  HOME CARE INSTRUCTIONS  Only take over-the-counter or prescription medicines for pain, fever, or discomfort as directed by your caregiver.  Rinse your mouth (gargle) often with salt water ( tsp salt in 8 oz [250 ml] of warm water) to relieve pain or swelling.  Do not drive after taking pain medicine (narcotics).  Do not apply heat to  the outside of your face.  Return to your dentist for further treatment as directed. SEEK MEDICAL CARE IF:  Your pain is not helped by medicine.  Your pain is getting worse instead of better. SEEK IMMEDIATE MEDICAL CARE IF:  You have a fever or persistent symptoms for more than 2-3 days.  You have a fever and your symptoms suddenly get worse.  You have chills or a very bad headache.  You have problems breathing or swallowing.  You have trouble opening your mouth.  You have swelling in the neck or around the eye. Document Released: 12/21/2004 Document Revised: 09/15/2011 Document Reviewed: 03/31/2010 [facility] Patient Information 2015 [place], [place]. This information is not intended to replace advice given to you by your health care provider. Make sure you discuss any questions you have with your health care provider.

## 2013-07-14 NOTE — ED Provider Notes (Signed)
Medical screening examination/treatment/procedure(s) were performed by non-physician practitioner and as supervising physician I was immediately available for consultation/collaboration.  Helios Kohlmann, M.D.  Alessander Sikorski C Cathryn Gallery, MD 07/14/13 0849 

## 2013-10-11 ENCOUNTER — Encounter (HOSPITAL_COMMUNITY): Payer: Self-pay | Admitting: Family Medicine

## 2013-10-11 ENCOUNTER — Emergency Department (INDEPENDENT_AMBULATORY_CARE_PROVIDER_SITE_OTHER)
Admission: EM | Admit: 2013-10-11 | Discharge: 2013-10-11 | Disposition: A | Discharge status: Home / Self Care | Payer: BC Managed Care – PPO | Source: Home / Self Care | Attending: Family Medicine | Admitting: Family Medicine

## 2013-10-11 DIAGNOSIS — M62838 Other muscle spasm: Secondary | ICD-10-CM

## 2013-10-11 DIAGNOSIS — M546 Pain in thoracic spine: Secondary | ICD-10-CM

## 2013-10-11 MED ORDER — METAXALONE 800 MG PO TABS: 400.0000 mg | ORAL_TABLET | Freq: Three times a day (TID) | ORAL | Status: DC | PRN

## 2013-10-11 MED ORDER — TRAMADOL HCL 50 MG PO TABS: 50.0000 mg | ORAL_TABLET | Freq: Four times a day (QID) | ORAL | Status: DC | PRN

## 2013-10-11 MED ORDER — METHOCARBAMOL 500 MG PO TABS: 500.0000 mg | ORAL_TABLET | Freq: Four times a day (QID) | ORAL | Status: DC | PRN
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
## 2013-10-11 NOTE — ED Provider Notes (Signed)
CSN: [id number]     Arrival date & time 10/11/13  [id number] History   First MD Initiated Contact with Patient 10/11/13 1023     Chief Complaint  Patient presents with  . Back Pain   (Consider location/radiation/quality/duration/timing/severity/associated sxs/prior Treatment) HPI  Lower back pain: 2 wks ago. Getting worse. Heat and ibuprofen 800 adn naprosyn and tramadol w/ some benefit. No radiation. Worse w/ certain movements. Standing motion is the worst. Started after having sex with husband - soreness started a couple days later. Denies any saddle anesthesia, loss of bowel or bladder function.   Past Medical History  Diagnosis Date  . Anemia     resolved  . Migraine    Past Surgical History  Procedure Laterality Date  . Cesarean section    . Tubal ligation    . Uterine ablation     Family History  Problem Relation Age of Onset  . Hypertension Mother   . Diabetes Mother   . Hypertension Father   . Heart attack Neg Hx   . Hyperlipidemia Neg Hx   . Sudden death Neg Hx    History  Substance Use Topics  . Smoking status: Former Games developermoker  . Smokeless tobacco: Not on file  . Alcohol Use: Yes     Comment: occasional   OB History   Grav Para Term Preterm Abortions TAB SAB Ect Mult Living                 Review of Systems Per HPI with all other pertinent systems negative.   Allergies  Morphine and related  Home Medications   Prior to Admission medications   Medication Sig Start Date End Date Taking? Authorizing Provider  albuterol (PROVENTIL HFA;VENTOLIN HFA) 108 (90 BASE) MCG/ACT inhaler Inhale 2 puffs into the lungs every 4 (four) hours as needed for wheezing or shortness of breath. 04/30/13   Gilda Creasehristopher J. Pollina, MD  amoxicillin-clavulanate (AUGMENTIN) 875-125 MG per tablet Take 1 tablet by mouth every 12 (twelve) hours. 07/12/13   Adrian BlackwaterZachary H Baker, PA-C  clindamycin (CLEOCIN) 300 MG capsule Take 1 capsule (300 mg total) by mouth 4 (four) times daily. X 7 days 07/01/13    Ria ClockJennifer Lee H Presson, PA  ferrous sulfate 325 (65 FE) MG tablet Take 325 mg by mouth daily with breakfast.      Historical Provider, MD  fluticasone (FLONASE) 50 MCG/ACT nasal spray Place 1 spray into both nostrils daily. 04/30/13   Gilda Creasehristopher J. Pollina, MD  metaxalone (SKELAXIN) 800 MG tablet Take 0.5-1 tablets (400-800 mg total) by mouth 3 (three) times daily as needed for muscle spasms. 10/11/13   Ozella Rocksavid J Merrell, MD  metroNIDAZOLE (FLAGYL) 500 MG tablet Take 1 tablet (500 mg total) by mouth 2 (two) times daily. 06/04/13   Teressa LowerVrinda Pickering, NP  naproxen (NAPROSYN) 500 MG tablet Take 1 tablet (500 mg total) by mouth 2 (two) times daily. 03/21/13   Rodolph BongEvan S Corey, MD  oxyCODONE-acetaminophen (PERCOCET/ROXICET) 5-325 MG per tablet Take 1 tablet by mouth every 6 (six) hours as needed for moderate pain or severe pain. 06/09/13   Toy CookeyMegan Docherty, MD  predniSONE (DELTASONE) 20 MG tablet Take 2 tablets (40 mg total) by mouth daily with breakfast. 04/30/13   Gilda Creasehristopher J. Pollina, MD  traMADol (ULTRAM) 50 MG tablet Take 1 tablet (50 mg total) by mouth every 6 (six) hours as needed. 03/21/13   Rodolph BongEvan S Corey, MD  traMADol (ULTRAM) 50 MG tablet Take 1 tablet (50 mg total) by mouth every  6 (six) hours as needed. 04/30/13   Gilda Crease, MD  traMADol (ULTRAM) 50 MG tablet Take 2 tablets (100 mg total) by mouth every 6 (six) hours as needed. 06/04/13   Teressa Lower, NP  traMADol (ULTRAM) 50 MG tablet Take 1 tablet (50 mg total) by mouth every 6 (six) hours as needed. 07/12/13   Graylon Good, PA-C  traMADol (ULTRAM) 50 MG tablet Take 1 tablet (50 mg total) by mouth every 6 (six) hours as needed for moderate pain or severe pain. 10/11/13   Ozella Rocks, MD   BP 126/79  Pulse 64  Temp(Src) 98.5 F (36.9 C) (Oral)  Resp 14  SpO2 100% Physical Exam  Constitutional: She is oriented to person, place, and time. She appears well-developed and well-nourished. No distress.  HENT:  Head: Normocephalic and  atraumatic.  Eyes: EOM are normal. Pupils are equal, round, and reactive to light.  Neck: Normal range of motion.  Cardiovascular: Normal rate, normal heart sounds and intact distal pulses.   No murmur heard. Pulmonary/Chest: Effort normal and breath sounds normal.  Abdominal: Soft. Bowel sounds are normal. She exhibits no distension.  Musculoskeletal:  FROM of back. Mild perispinal thoracic ttp and muscle tightness. Ambulation w/o difficulty. Thigh flexion against resistance w/o pain  Neurological: She is alert and oriented to person, place, and time.  Skin: Skin is warm. She is not diaphoretic.  Psychiatric: She has a normal mood and affect. Her behavior is normal. Judgment and thought content normal.    ED Course  Procedures (including critical care time) Labs Review Labs Reviewed - No data to display  Imaging Review No results found.   MDM   1. Muscle spasm   2. Right-sided thoracic back pain    NSAIDs, Robaxin, stretching, exercises, heat, massage, change positions at work, try back support if wanted. F/u PRN Precautions given and all questions answered   Shelly Flatten, MD Family Medicine 10/11/2013, 10:56 AM      Ozella Rocks, MD 10/11/13 1056

## 2013-10-11 NOTE — Discharge Instructions (Signed)
You are likely suffering from muscle spasms in your back There is no evidence of fracture or sginificant spinal condition.  Please start the back exercises, heat, massage, Ibuprofen 800mg  every 8 hours for several days, and the robaxin. Please stay active and try to get better back support at work Please consider using a back support brace Please come back if you are not better in another 2-4 weeks   Back Exercises These exercises may help you when beginning to rehabilitate your injury. Your symptoms may resolve with or without further involvement from your physician, physical therapist or athletic trainer. While completing these exercises, remember:   Restoring tissue flexibility helps normal motion to return to the joints. This allows healthier, less painful movement and activity.  An effective stretch should be held for at least 30 seconds.  A stretch should never be painful. You should only feel a gentle lengthening or release in the stretched tissue. STRETCH - Extension, Prone on Elbows   Lie on your stomach on the floor, a bed will be too soft. Place your palms about shoulder width apart and at the height of your head.  Place your elbows under your shoulders. If this is too painful, stack pillows under your chest.  Allow your body to relax so that your hips drop lower and make contact more completely with the floor.  Hold this position for __________ seconds.  Slowly return to lying flat on the floor. Repeat __________ times. Complete this exercise __________ times per day.  RANGE OF MOTION - Extension, Prone Press Ups   Lie on your stomach on the floor, a bed will be too soft. Place your palms about shoulder width apart and at the height of your head.  Keeping your back as relaxed as possible, slowly straighten your elbows while keeping your hips on the floor. You may adjust the placement of your hands to maximize your comfort. As you gain motion, your hands will come more  underneath your shoulders.  Hold this position __________ seconds.  Slowly return to lying flat on the floor. Repeat __________ times. Complete this exercise __________ times per day.  RANGE OF MOTION- Quadruped, Neutral Spine   Assume a hands and knees position on a firm surface. Keep your hands under your shoulders and your knees under your hips. You may place padding under your knees for comfort.  Drop your head and point your tail bone toward the ground below you. This will round out your low back like an angry cat. Hold this position for __________ seconds.  Slowly lift your head and release your tail bone so that your back sags into a large arch, like an old horse.  Hold this position for __________ seconds.  Repeat this until you feel limber in your low back.  Now, find your "sweet spot." This will be the most comfortable position somewhere between the two previous positions. This is your neutral spine. Once you have found this position, tense your stomach muscles to support your low back.  Hold this position for __________ seconds. Repeat __________ times. Complete this exercise __________ times per day.  STRETCH - Flexion, Single Knee to Chest   Lie on a firm bed or floor with both legs extended in front of you.  Keeping one leg in contact with the floor, bring your opposite knee to your chest. Hold your leg in place by either grabbing behind your thigh or at your knee.  Pull until you feel a gentle stretch in your low  back. Hold __________ seconds.  Slowly release your grasp and repeat the exercise with the opposite side. Repeat __________ times. Complete this exercise __________ times per day.  STRETCH - Hamstrings, Standing  Stand or sit and extend your right / left leg, placing your foot on a chair or foot stool  Keeping a slight arch in your low back and your hips straight forward.  Lead with your chest and lean forward at the waist until you feel a gentle stretch  in the back of your right / left knee or thigh. (When done correctly, this exercise requires leaning only a small distance.)  Hold this position for __________ seconds. Repeat __________ times. Complete this stretch __________ times per day. STRENGTHENING - Deep Abdominals, Pelvic Tilt   Lie on a firm bed or floor. Keeping your legs in front of you, bend your knees so they are both pointed toward the ceiling and your feet are flat on the floor.  Tense your lower abdominal muscles to press your low back into the floor. This motion will rotate your pelvis so that your tail bone is scooping upwards rather than pointing at your feet or into the floor.  With a gentle tension and even breathing, hold this position for __________ seconds. Repeat __________ times. Complete this exercise __________ times per day.  STRENGTHENING - Abdominals, Crunches   Lie on a firm bed or floor. Keeping your legs in front of you, bend your knees so they are both pointed toward the ceiling and your feet are flat on the floor. Cross your arms over your chest.  Slightly tip your chin down without bending your neck.  Tense your abdominals and slowly lift your trunk high enough to just clear your shoulder blades. Lifting higher can put excessive stress on the low back and does not further strengthen your abdominal muscles.  Control your return to the starting position. Repeat __________ times. Complete this exercise __________ times per day.  STRENGTHENING - Quadruped, Opposite UE/LE Lift   Assume a hands and knees position on a firm surface. Keep your hands under your shoulders and your knees under your hips. You may place padding under your knees for comfort.  Find your neutral spine and gently tense your abdominal muscles so that you can maintain this position. Your shoulders and hips should form a rectangle that is parallel with the floor and is not twisted.  Keeping your trunk steady, lift your right hand no  higher than your shoulder and then your left leg no higher than your hip. Make sure you are not holding your breath. Hold this position __________ seconds.  Continuing to keep your abdominal muscles tense and your back steady, slowly return to your starting position. Repeat with the opposite arm and leg. Repeat __________ times. Complete this exercise __________ times per day. Document Released: 01/08/2005 Document Revised: 03/15/2011 Document Reviewed: 04/04/2008 [facility] Patient Information 2015 [place]ExitCare, [place]. This information is not intended to replace advice given to you by your health care provider. Make sure you discuss any questions you have with your health care provider.

## 2013-10-16 ENCOUNTER — Emergency Department (INDEPENDENT_AMBULATORY_CARE_PROVIDER_SITE_OTHER)
Admission: EM | Admit: 2013-10-16 | Discharge: 2013-10-16 | Disposition: A | Discharge status: Home / Self Care | Payer: BC Managed Care – PPO | Source: Home / Self Care

## 2013-10-16 ENCOUNTER — Encounter (HOSPITAL_COMMUNITY): Payer: Self-pay | Admitting: Emergency Medicine

## 2013-10-16 DIAGNOSIS — M62830 Muscle spasm of back: Secondary | ICD-10-CM

## 2013-10-16 DIAGNOSIS — S29012D Strain of muscle and tendon of back wall of thorax, subsequent encounter: Secondary | ICD-10-CM

## 2013-10-16 DIAGNOSIS — M545 Low back pain, unspecified: Secondary | ICD-10-CM

## 2013-10-16 DIAGNOSIS — S29019D Strain of muscle and tendon of unspecified wall of thorax, subsequent encounter: Secondary | ICD-10-CM

## 2013-10-16 MED ORDER — CYCLOBENZAPRINE HCL 5 MG PO TABS: 5.0000 mg | ORAL_TABLET | Freq: Three times a day (TID) | ORAL | Status: DC | PRN

## 2013-10-16 MED ORDER — TRAMADOL HCL 50 MG PO TABS: 50.0000 mg | ORAL_TABLET | Freq: Four times a day (QID) | ORAL | Status: DC | PRN

## 2013-10-16 NOTE — ED Notes (Signed)
Pt states that she was here last week back pain muscle spasms was given robaxin that if it made her sick on her stomach to come back and get another type of medication. Pt states that it worked she made her so sick on her stomach.pt is in no acute distress at this time.

## 2013-10-16 NOTE — ED Provider Notes (Signed)
CSN: [id number]     Arrival date & time 10/16/13  1529 History   First MD Initiated Contact with Patient 10/16/13 1619     Chief Complaint  Patient presents with  . Follow-up    back pain   (Consider location/radiation/quality/duration/timing/severity/associated sxs/prior Treatment) HPI Comments: Pt was evaluated at the Urgent Care last week for back pain/muscle strain and tx with Tramadol and Robaxin. She st these meds help with the pain but the Robaxin is making her nauseated. SHe is requesting another type muscle relaxant and a refill of Tramadol. Denies focal weakness, numbness. St complying with the physical therapy as directed last visit.   Past Medical History  Diagnosis Date  . Anemia     resolved  . Migraine    Past Surgical History  Procedure Laterality Date  . Cesarean section    . Tubal ligation    . Uterine ablation     Family History  Problem Relation Age of Onset  . Hypertension Mother   . Diabetes Mother   . Hypertension Father   . Heart attack Neg Hx   . Hyperlipidemia Neg Hx   . Sudden death Neg Hx    History  Substance Use Topics  . Smoking status: Former Games developermoker  . Smokeless tobacco: Not on file  . Alcohol Use: No     Comment: occasional   OB History   Grav Para Term Preterm Abortions TAB SAB Ect Mult Living                 Review of Systems  Constitutional: Positive for activity change. Negative for fever and fatigue.  Respiratory: Negative for cough and shortness of breath.   Gastrointestinal: Positive for nausea.  Genitourinary: Negative.   Musculoskeletal: Positive for back pain. Negative for gait problem and neck pain.  Neurological: Negative.     Allergies  Morphine and related  Home Medications   Prior to Admission medications   Medication Sig Start Date End Date Taking? Authorizing Provider  ferrous sulfate 325 (65 FE) MG tablet Take 325 mg by mouth daily with breakfast.     Yes Historical Provider, MD  methocarbamol (ROBAXIN)  500 MG tablet Take 1-2 tablets (500-1,000 mg total) by mouth every 6 (six) hours as needed for muscle spasms. 10/11/13  Yes Ozella Rocksavid J Merrell, MD  cyclobenzaprine (FLEXERIL) 5 MG tablet Take 1 tablet (5 mg total) by mouth 3 (three) times daily as needed for muscle spasms. 10/16/13   Hayden Rasmussenavid Patrice Matthew, NP  traMADol (ULTRAM) 50 MG tablet Take 1 tablet (50 mg total) by mouth every 6 (six) hours as needed. 04/30/13   Gilda Creasehristopher J. Pollina, MD  traMADol (ULTRAM) 50 MG tablet Take 1 tablet (50 mg total) by mouth every 6 (six) hours as needed. 10/16/13   Hayden Rasmussenavid Tayna Smethurst, NP   BP 125/71  Pulse 61  Temp(Src) 98.7 F (37.1 C) (Oral)  Resp 16 Physical Exam  Nursing note and vitals reviewed. Constitutional: She is oriented to person, place, and time. She appears well-developed and well-nourished. No distress.  Eyes: EOM are normal.  Neck: Normal range of motion. Neck supple.  Cardiovascular: Normal rate.   Pulmonary/Chest: Effort normal. No respiratory distress.  Musculoskeletal: She exhibits no edema.  Tenderness along the bilateral parathoracic/lumbar musculature. No spinal tenderness.   Neurological: She is alert and oriented to person, place, and time. She exhibits normal muscle tone.  Skin: Skin is warm and dry.  Psychiatric: She has a normal mood and affect.    ED  Course  Procedures (including critical care time) Labs Review Labs Reviewed - No data to display  Imaging Review No results found.   MDM   1. Bilateral low back pain without sciatica   2. Muscle spasm of back   3. Strain of thoracic paraspinal muscles excluding T1 and T2 levels, subsequent encounter    COntinue with self P.T. As directed earlier and reinforced today Good body and back mechanics and ergonomics Flexeril 5 mg #25 Tramadol 50 mg #15    Hayden Rasmussenavid Aleyssa Pike, NP 10/16/13 1644

## 2013-10-16 NOTE — Discharge Instructions (Signed)
Back Pain, Adult °Low back pain is very common. About 1 in 5 people have back pain. The cause of low back pain is rarely dangerous. The pain often gets better over time. About half of people with a sudden onset of back pain feel better in just 2 weeks. About 8 in 10 people feel better by 6 weeks.  °CAUSES °Some common causes of back pain include: °· Strain of the muscles or ligaments supporting the spine. °· Wear and tear (degeneration) of the spinal discs. °· Arthritis. °· Direct injury to the back. °DIAGNOSIS °Most of the time, the direct cause of low back pain is not known. However, back pain can be treated effectively even when the exact cause of the pain is unknown. Answering your caregiver's questions about your overall health and symptoms is one of the most accurate ways to make sure the cause of your pain is not dangerous. If your caregiver needs more information, he or she may order lab work or imaging tests (X-rays or MRIs). However, even if imaging tests show changes in your back, this usually does not require surgery. °HOME CARE INSTRUCTIONS °For many people, back pain returns. Since low back pain is rarely dangerous, it is often a condition that people can learn to manage on their own.  °· Remain active. It is stressful on the back to sit or stand in one place. Do not sit, drive, or stand in one place for more than 30 minutes at a time. Take short walks on level surfaces as soon as pain allows. Try to increase the length of time you walk each day. °· Do not stay in bed. Resting more than 1 or 2 days can delay your recovery. °· Do not avoid exercise or work. Your body is made to move. It is not dangerous to be active, even though your back may hurt. Your back will likely heal faster if you return to being active before your pain is gone. °· Pay attention to your body when you  bend and lift. Many people have less discomfort when lifting if they bend their knees, keep the load close to their bodies, and  avoid twisting. Often, the most comfortable positions are those that put less stress on your recovering back. °· Find a comfortable position to sleep. Use a firm mattress and lie on your side with your knees slightly bent. If you lie on your back, put a pillow under your knees. °· Only take over-the-counter or prescription medicines as directed by your caregiver. Over-the-counter medicines to reduce pain and inflammation are often the most helpful. Your caregiver may prescribe muscle relaxant drugs. These medicines help dull your pain so you can more quickly return to your normal activities and healthy exercise. °· Put ice on the injured area. °¨ Put ice in a plastic bag. °¨ Place a towel between your skin and the bag. °¨ Leave the ice on for 15-20 minutes, 03-04 times a day for the first 2 to 3 days. After that, ice and heat may be alternated to reduce pain and spasms. °· Ask your caregiver about trying back exercises and gentle massage. This may be of some benefit. °· Avoid feeling anxious or stressed. Stress increases muscle tension and can worsen back pain. It is important to recognize when you are anxious or stressed and learn ways to manage it. Exercise is a great option. °SEEK MEDICAL CARE IF: °· You have pain that is not relieved with rest or medicine. °· You have pain that does not improve in 1 week. °· You have new symptoms. °· You are generally not feeling well. °SEEK   IMMEDIATE MEDICAL CARE IF:   You have pain that radiates from your back into your legs.  You develop new bowel or bladder control problems.  You have unusual weakness or numbness in your arms or legs.  You develop nausea or vomiting.  You develop abdominal pain.  You feel faint. Document Released: 12/21/2004 Document Revised: 06/22/2011 Document Reviewed: 04/24/2013 [facility] CenterExitCare Patient Information 2015 [place]ExitCare, [place]LL[place]. This information is not intended to replace advice given to you by your health care provider. Make sure you  discuss any questions you have with your health care provider.  Muscle Cramps and Spasms Muscle cramps and spasms are when muscles tighten by themselves. They usually get better within minutes. Muscle cramps are painful. They are usually stronger and last longer than muscle spasms. Muscle spasms may or may not be painful. They can last a few seconds or much longer. HOME CARE  Drink enough fluid to keep your pee (urine) clear or pale yellow.  Massage, stretch, and relax the muscle.  Use a warm towel, heating pad, or warm shower water on tight muscles.  Place ice on the muscle if it is tender or in pain.  Put ice in a plastic bag.  Place a towel between your skin and the bag.  Leave the ice on for 15-20 minutes, 03-04 times a day.  Only take medicine as told by your doctor. GET HELP RIGHT AWAY IF:  Your cramps or spasms get worse, happen more often, or do not get better with time. MAKE SURE YOU:  Understand these instructions.  Will watch your condition.  Will get help right away if you are not doing well or get worse. Document Released: 12/04/2007 Document Revised: 04/17/2012 Document Reviewed: 12/08/2011 [facility]ExitCare Patient Information 2015 [place]ExitCare, [place]LLC. This information is not intended to replace advice given to you by your health care provider. Make sure you discuss any questions you have with your health care provider.

## 2013-10-17 NOTE — ED Provider Notes (Signed)
Medical screening examination/treatment/procedure(s) were performed by a resident physician or non-physician practitioner and as the supervising physician I was immediately available for consultation/collaboration.  Vernis Eid, MD Family Medicine   Skyleigh Windle J Yusra Ravert, MD 10/17/13 1807 

## 2013-10-20 ENCOUNTER — Emergency Department (HOSPITAL_BASED_OUTPATIENT_CLINIC_OR_DEPARTMENT_OTHER)
Admission: EM | Admit: 2013-10-20 | Discharge: 2013-10-20 | Disposition: A | Discharge status: Home / Self Care | Payer: BC Managed Care – PPO | Attending: Emergency Medicine | Admitting: Emergency Medicine

## 2013-10-20 ENCOUNTER — Encounter (HOSPITAL_BASED_OUTPATIENT_CLINIC_OR_DEPARTMENT_OTHER): Payer: Self-pay | Admitting: Emergency Medicine

## 2013-10-20 DIAGNOSIS — M62830 Muscle spasm of back: Secondary | ICD-10-CM | POA: Insufficient documentation

## 2013-10-20 DIAGNOSIS — Z8679 Personal history of other diseases of the circulatory system: Secondary | ICD-10-CM | POA: Insufficient documentation

## 2013-10-20 DIAGNOSIS — Z87891 Personal history of nicotine dependence: Secondary | ICD-10-CM | POA: Insufficient documentation

## 2013-10-20 DIAGNOSIS — D649 Anemia, unspecified: Secondary | ICD-10-CM | POA: Diagnosis not present

## 2013-10-20 DIAGNOSIS — M545 Low back pain: Secondary | ICD-10-CM | POA: Diagnosis present

## 2013-10-20 DIAGNOSIS — Z79899 Other long term (current) drug therapy: Secondary | ICD-10-CM | POA: Insufficient documentation

## 2013-10-20 LAB — URINALYSIS, ROUTINE W REFLEX MICROSCOPIC
Bilirubin Urine: NEGATIVE
GLUCOSE, UA: NEGATIVE mg/dL
HGB URINE DIPSTICK: NEGATIVE
KETONES UR: NEGATIVE mg/dL
Leukocytes, UA: NEGATIVE
Nitrite: NEGATIVE
PH: 7 (ref 5.0–8.0)
PROTEIN: NEGATIVE mg/dL
Specific Gravity, Urine: 1.026 (ref 1.005–1.030)
Urobilinogen, UA: 1 mg/dL (ref 0.0–1.0)

## 2013-10-20 MED ORDER — METHOCARBAMOL 500 MG PO TABS: 500.0000 mg | ORAL_TABLET | Freq: Three times a day (TID) | ORAL | Status: DC | PRN

## 2013-10-20 MED ORDER — TRAMADOL HCL 50 MG PO TABS: 50.0000 mg | ORAL_TABLET | Freq: Four times a day (QID) | ORAL | Status: DC | PRN

## 2013-10-20 MED ORDER — ONDANSETRON 4 MG PO TBDP: ORAL_TABLET | ORAL | Status: DC

## 2013-10-20 NOTE — ED Notes (Signed)
Pt c/o lower back pain x1 week. [facility] for flexeril and tramadol which took the edge off butmade her nauseas.

## 2013-10-20 NOTE — Discharge Instructions (Signed)
Continue Robaxin or flexeril as directed for muscle spasm. No driving or operating heavy machinery while taking drug as it may cause drowsiness. Take tramadol as directed. Continue applying heat. Zofran is for nausea.  Muscle Cramps and Spasms Muscle cramps and spasms occur when a muscle or muscles tighten and you have no control over this tightening (involuntary muscle contraction). They are a common problem and can develop in any muscle. The most common place is in the calf muscles of the leg. Both muscle cramps and muscle spasms are involuntary muscle contractions, but they also have differences:   Muscle cramps are sporadic and painful. They may last a few seconds to a quarter of an hour. Muscle cramps are often more forceful and last longer than muscle spasms.  Muscle spasms may or may not be painful. They may also last just a few seconds or much longer. CAUSES  It is uncommon for cramps or spasms to be due to a serious underlying problem. In many cases, the cause of cramps or spasms is unknown. Some common causes are:   Overexertion.   Overuse from repetitive motions (doing the same thing over and over).   Remaining in a certain position for a long period of time.   Improper preparation, form, or technique while performing a sport or activity.   Dehydration.   Injury.   Side effects of some medicines.   Abnormally low levels of the salts and ions in your blood (electrolytes), especially potassium and calcium. This could happen if you are taking water pills (diuretics) or you are pregnant.  Some underlying medical problems can make it more likely to develop cramps or spasms. These include, but are not limited to:   Diabetes.   Parkinson disease.   Hormone disorders, such as thyroid problems.   Alcohol abuse.   Diseases specific to muscles, joints, and bones.   Blood vessel disease where not enough blood is getting to the muscles.  HOME CARE INSTRUCTIONS    Stay well hydrated. Drink enough water and fluids to keep your urine clear or pale yellow.  It may be helpful to massage, stretch, and relax the affected muscle.  For tight or tense muscles, use a warm towel, heating pad, or hot shower water directed to the affected area.  If you are sore or have pain after a cramp or spasm, applying ice to the affected area may relieve discomfort.  Put ice in a plastic bag.  Place a towel between your skin and the bag.  Leave the ice on for 15-20 minutes, 03-04 times a day.  Medicines used to treat a known cause of cramps or spasms may help reduce their frequency or severity. Only take over-the-counter or prescription medicines as directed by your caregiver. SEEK MEDICAL CARE IF:  Your cramps or spasms get more severe, more frequent, or do not improve over time.  MAKE SURE YOU:   Understand these instructions.  Will watch your condition.  Will get help right away if you are not doing well or get worse. Document Released: 06/12/2001 Document Revised: 04/17/2012 Document Reviewed: 12/08/2011 [facility]ExitCare Patient Information 2015 [place]ExitCare, [place]LL[place]. This information is not intended to replace advice given to you by your health care provider. Make sure you discuss any questions you have with your health care provider.  Spasticity Spasticity is a condition in which certain muscles contract continuously. This causes stiffness or tightness of the muscles. It may interfere with movement, speech, and manner of walking. CAUSES  This condition is  usually caused by damage to the portion of the brain or spinal cord that controls voluntary movement. It may occur in association with:  Spinal cord injury.  Multiple sclerosis.  Cerebral palsy.  Brain damage due to lack of oxygen.  Brain trauma.  Severe head injury.  Metabolic diseases such as:  Adrenoleukodystrophy.  ALS Truddie Hidden(Lou Gehrig's disease).  Phenylketonuria. SYMPTOMS   Increased muscle tone  (hypertonicity).  A series of rapid muscle contractions (clonus).  Exaggerated deep tendon reflexes.  Muscle spasms.  Involuntary crossing of the legs (scissoring).  Fixed joints. The degree of spasticity varies. It ranges from mild muscle stiffness to severe, painful, and uncontrollable muscle spasms. It can interfere with rehabilitation in patients with certain disorders. It often interferes with daily activities. TREATMENT  Treatment may include:  Medications.  Physical therapy regimens. They may include muscle stretching and range of motion exercises. These help prevent shrinkage or shortening of muscles. They also help reduce the severity of symptoms.  Surgery. This may be recommended for tendon release or to sever the nerve-muscle pathway. PROGNOSIS  The outcome for those with spasticity depends on:  Severity of the spasticity.  Associated disorder(s). Document Released: 12/11/2001 Document Revised: 03/15/2011 Document Reviewed: 03/06/2013 [facility] CenterExitCare Patient Information 2015 [place]ExitCare, [place]LLC. This information is not intended to replace advice given to you by your health care provider. Make sure you discuss any questions you have with your health care provider.

## 2013-10-20 NOTE — ED Provider Notes (Signed)
CSN: [id number]     Arrival date & time 10/20/13  1153 History   First MD Initiated Contact with Patient 10/20/13 1220     Chief Complaint  Patient presents with  . Back Pain     (Consider location/radiation/quality/duration/timing/severity/associated sxs/prior Treatment) HPI Comments: This is a 43 year old female who presents to the emergency department complaining of continued back pain since being seen at urgent care on October 8. Patient reports she was having intercourse with her husband in a chair and was in an awkward position, and the next day she developed low back pain. Pain has been intermittent since, worse if she sits for long period of time, relieved temporarily by prescribed muscle relaxers, ibuprofen, tramadol and heat. States she had to go back to urgent care because the Robaxin was upsetting her stomach. They then prescribed her Flexeril at a low-dose, however still states it makes her stomach upset. Denies any new injury. Denies pain, numbness or tingling radiating down her extremities. No loss of control of bowel or bladder or saddle anesthesia.  Patient is a 43 y.o. female presenting with back pain. The history is provided by the patient.  Back Pain   Past Medical History  Diagnosis Date  . Anemia     resolved  . Migraine    Past Surgical History  Procedure Laterality Date  . Cesarean section    . Tubal ligation    . Uterine ablation     Family History  Problem Relation Age of Onset  . Hypertension Mother   . Diabetes Mother   . Hypertension Father   . Heart attack Neg Hx   . Hyperlipidemia Neg Hx   . Sudden death Neg Hx    History  Substance Use Topics  . Smoking status: Former Games developermoker  . Smokeless tobacco: Not on file  . Alcohol Use: No     Comment: occasional   OB History   Grav Para Term Preterm Abortions TAB SAB Ect Mult Living                 Review of Systems  Musculoskeletal: Positive for back pain.  All other systems reviewed and are  negative.     Allergies  Morphine and related  Home Medications   Prior to Admission medications   Medication Sig Start Date End Date Taking? Authorizing Provider  cyclobenzaprine (FLEXERIL) 5 MG tablet Take 1 tablet (5 mg total) by mouth 3 (three) times daily as needed for muscle spasms. 10/16/13   Hayden Rasmussenavid Mabe, NP  ferrous sulfate 325 (65 FE) MG tablet Take 325 mg by mouth daily with breakfast.      Historical Provider, MD  methocarbamol (ROBAXIN) 500 MG tablet Take 1-2 tablets (500-1,000 mg total) by mouth every 6 (six) hours as needed for muscle spasms. 10/11/13   Ozella Rocksavid J Merrell, MD  methocarbamol (ROBAXIN) 500 MG tablet Take 1 tablet (500 mg total) by mouth every 8 (eight) hours as needed for muscle spasms. 10/20/13   Kathrynn Speedobyn M Peri Kreft, PA-C  traMADol (ULTRAM) 50 MG tablet Take 1 tablet (50 mg total) by mouth every 6 (six) hours as needed. 04/30/13   Gilda Creasehristopher J. Pollina, MD  traMADol (ULTRAM) 50 MG tablet Take 1 tablet (50 mg total) by mouth every 6 (six) hours as needed. 10/16/13   Hayden Rasmussenavid Mabe, NP  traMADol (ULTRAM) 50 MG tablet Take 1 tablet (50 mg total) by mouth every 6 (six) hours as needed. 10/20/13   Bridgitt Raggio M Shloka Baldridge, PA-C   BP 127/71  Pulse 80  Temp(Src) 98.6 F (37 C) (Oral)  Resp 16  Ht 5\' 10"  (1.778 m)  Wt 168 lb (76.204 kg)  BMI 24.11 kg/m2  SpO2 100% Physical Exam  Nursing note and vitals reviewed. Constitutional: She is oriented to person, place, and time. She appears well-developed and well-nourished. No distress.  HENT:  Head: Normocephalic and atraumatic.  Mouth/Throat: Oropharynx is clear and moist.  Eyes: Conjunctivae are normal.  Neck: Normal range of motion. Neck supple. No spinous process tenderness and no muscular tenderness present.  Cardiovascular: Normal rate, regular rhythm and normal heart sounds.   Pulmonary/Chest: Effort normal and breath sounds normal. No respiratory distress.  Musculoskeletal: She exhibits no edema.  TTP bilateral lumbar paraspinal  muscles with spasm, R>L.  Neurological: She is alert and oriented to person, place, and time. She has normal strength.  Strength lower extremities 5/5 and equal bilateral. Sensation intact. Normal gait.  Skin: Skin is warm and dry. No rash noted. She is not diaphoretic.  Psychiatric: She has a normal mood and affect. Her behavior is normal.    ED Course  Procedures (including critical care time) Labs Review Labs Reviewed  URINALYSIS, ROUTINE W REFLEX MICROSCOPIC    Imaging Review No results found.   EKG Interpretation None      MDM   Final diagnoses:  Muscle spasm of back   Patient nontoxic appearing and in no apparent distress. Neurovascularly intact. No signs or symptoms of central cord compression or cauda equina. She ambulates without difficulty. I advised her to continue tramadol, ibuprofen and heat. Given the significance of her muscle spasm on the right side, advised her to continue the muscle relaxers, however will prescribe Zofran and advised her to take the medications with food. Stable for discharge. Return precautions given. Patient states understanding of treatment care plan and is agreeable.  Kathrynn SpeedRobyn M Staci Carver, PA-C 10/20/13 1306

## 2013-10-21 NOTE — ED Provider Notes (Signed)
Medical screening examination/treatment/procedure(s) were performed by non-physician practitioner and as supervising physician I was immediately available for consultation/collaboration.   EKG Interpretation None        Rolland PorterMark Elleah Hemsley, MD 10/21/13 1325

## 2013-11-07 ENCOUNTER — Encounter (HOSPITAL_COMMUNITY): Payer: Self-pay | Admitting: Emergency Medicine

## 2013-11-07 ENCOUNTER — Emergency Department (INDEPENDENT_AMBULATORY_CARE_PROVIDER_SITE_OTHER)
Admission: EM | Admit: 2013-11-07 | Discharge: 2013-11-07 | Disposition: A | Discharge status: Home / Self Care | Payer: BC Managed Care – PPO | Source: Home / Self Care | Attending: Emergency Medicine | Admitting: Emergency Medicine

## 2013-11-07 DIAGNOSIS — K088 Other specified disorders of teeth and supporting structures: Secondary | ICD-10-CM

## 2013-11-07 DIAGNOSIS — K0889 Other specified disorders of teeth and supporting structures: Secondary | ICD-10-CM

## 2013-11-07 MED ORDER — PENICILLIN V POTASSIUM 500 MG PO TABS: 500.0000 mg | ORAL_TABLET | Freq: Four times a day (QID) | ORAL | Status: AC

## 2013-11-07 MED ORDER — TRAMADOL HCL 50 MG PO TABS: 50.0000 mg | ORAL_TABLET | Freq: Four times a day (QID) | ORAL | Status: DC | PRN

## 2013-11-07 NOTE — ED Provider Notes (Signed)
CSN: [id number]     Arrival date & time 11/07/13  1503 History   None    Chief Complaint  Patient presents with  . Dental Pain   (Consider location/radiation/quality/duration/timing/severity/associated sxs/prior Treatment) HPI  She is a 43 year old woman here for dental pain. She has a long history of dental issues particularly in the right upper jaw. She is working with an Transport planneroral surgeon to remedy these problems. She states it secondary to iron deficiency anemia associated with pica. Initially she was eating clay, but switched to starch, at which time she developed dental rot.  She has an appointment with her oral surgeon next week on the 13th. However, she has developed pain and swelling of the right upper jaw. She denies any drainage, fevers, chills, nausea. She is going to [place] this weekend for her mother's funeral.  Past Medical History  Diagnosis Date  . Anemia     resolved  . Migraine    Past Surgical History  Procedure Laterality Date  . Cesarean section    . Tubal ligation    . Uterine ablation     Family History  Problem Relation Age of Onset  . Hypertension Mother   . Diabetes Mother   . Hypertension Father   . Heart attack Neg Hx   . Hyperlipidemia Neg Hx   . Sudden death Neg Hx    History  Substance Use Topics  . Smoking status: Former Games developermoker  . Smokeless tobacco: Not on file  . Alcohol Use: No     Comment: occasional   OB History    No data available     Review of Systems  Constitutional: Negative for fever and chills.  HENT: Positive for dental problem.   Gastrointestinal: Negative for nausea and vomiting.    Allergies  Morphine and related  Home Medications   Prior to Admission medications   Medication Sig Start Date End Date Taking? Authorizing Provider  cyclobenzaprine (FLEXERIL) 5 MG tablet Take 1 tablet (5 mg total) by mouth 3 (three) times daily as needed for muscle spasms. 10/16/13   Hayden Rasmussenavid Mabe, NP  ferrous sulfate 325 (65 FE) MG  tablet Take 325 mg by mouth daily with breakfast.      Historical Provider, MD  ondansetron (ZOFRAN ODT) 4 MG disintegrating tablet 4mg  ODT q4 hours prn nausea/vomit 10/20/13   Robyn M Hess, PA-C  penicillin v potassium (VEETID) 500 MG tablet Take 1 tablet (500 mg total) by mouth 4 (four) times daily. 11/07/13 11/14/13  Charm RingsErin J Emorie Mcfate, MD  traMADol (ULTRAM) 50 MG tablet Take 1-2 tablets (50-100 mg total) by mouth every 6 (six) hours as needed for moderate pain or severe pain. 11/07/13   Charm RingsErin J Ken Bonn, MD   BP 111/62 mmHg  Pulse 88  Temp(Src) 98.4 F (36.9 C) (Oral)  Resp 16  SpO2 99% Physical Exam  Constitutional: She is oriented to person, place, and time. She appears well-developed and well-nourished. She appears distressed.  HENT:  Head:    Mouth/Throat: Abnormal dentition.    Cardiovascular: Normal rate.   Pulmonary/Chest: Effort normal.  Neurological: She is alert and oriented to person, place, and time.    ED Course  Procedures (including critical care time) Labs Review Labs Reviewed - No data to display  Imaging Review No results found.   MDM   1. Pain, dental    She has very poor dentition, particularly in the left upper jaw. Multiple decayed teeth. There is an area of increased tenderness and erythema.  No obvious abscess. Will treat with penicillin until she sees her oral surgeon on the 13th. Also provided prescription for tramadol to use as needed for pain.    Charm RingsErin J Kimanh Templeman, MD 11/07/13 [phone]

## 2013-11-07 NOTE — ED Notes (Signed)
C/o left upper dental pain onset 2 days Has appt w/dental surgeon next week Denies fevers, chills Alert, no signs of acute distress.

## 2013-11-07 NOTE — Discharge Instructions (Signed)
Take penicillin 1 pill 4 times a day until you see your oral surgeon. Use the tramadol 1-2 pills every 6 hours as needed for pain.

## 2013-12-08 ENCOUNTER — Encounter (HOSPITAL_COMMUNITY): Payer: Self-pay | Admitting: Emergency Medicine

## 2013-12-08 ENCOUNTER — Emergency Department (INDEPENDENT_AMBULATORY_CARE_PROVIDER_SITE_OTHER)
Admission: EM | Admit: 2013-12-08 | Discharge: 2013-12-08 | Disposition: A | Discharge status: Home / Self Care | Payer: BC Managed Care – PPO | Source: Home / Self Care | Attending: Emergency Medicine | Admitting: Emergency Medicine

## 2013-12-08 DIAGNOSIS — K029 Dental caries, unspecified: Secondary | ICD-10-CM

## 2013-12-08 MED ORDER — AMOXICILLIN 500 MG PO CAPS: 500.0000 mg | ORAL_CAPSULE | Freq: Three times a day (TID) | ORAL | Status: DC

## 2013-12-08 MED ORDER — METRONIDAZOLE 500 MG PO TABS: 500.0000 mg | ORAL_TABLET | Freq: Three times a day (TID) | ORAL | Status: DC

## 2013-12-08 MED ORDER — TRAMADOL HCL 50 MG PO TABS: 100.0000 mg | ORAL_TABLET | Freq: Three times a day (TID) | ORAL | Status: DC | PRN

## 2013-12-08 NOTE — ED Notes (Addendum)
C/o dental pain onset 3 days; upper teeth Reports she has an appt w/oral surg on 12/14 Oral surg has placed her on Clindamycin Pt reports it makes her nauseas and having diarrhea; would like something else Alert, no signs of acute distress.

## 2013-12-08 NOTE — Discharge Instructions (Signed)
Look up the [employer] for free dental clinics. Http://www.ncdental.org/ncds/Schedule.asp ° °Get there early and be prepared to wait. [employer] and [employer] have dental hygienist schools that provide low cost routine dental care.  ° °Other resources: °[facility] °[address] °[place], [place] °[phone] ° °Patients with Medicaid: °[facility]                     [facility] °[address]                                [address] °Phone:  [phone]                                                  Phone:  [phone] ° °Dr. Janice Civils °[address] °[phone] ° °If unable to pay or uninsured, contact:  [facility] or [facility] to become qualified for the adult dental clinic. ° °No matter what dental problem you have, it will not get better unless you get good dental care.  If the tooth is not taken care of, your symptoms will come back in time and you will be visiting us again in the Urgent Care Center with a bad toothache.  So, see your dentist as soon as possible.  If you don't have a dentist, we can give you a list of dentists.  Sometimes the most cost effective treatment is removal of the tooth.  This can be done very inexpensively through one of the low cost Affordable Dent[facility] Centers such as the facility on [address] in [place] ([phone]).  The downside to this is that you will have one less tooth and this can effect your ability to chew. ° °Some other things that can be done for a dental infection include the following: ° °· Rinse your mouth out with hot salt water (1/2 tsp of table salt and a pinch of baking soda in 8 oz of hot water).  You can do this every 2 or 3 hours. °· Avoid cold foods, beverages, and cold air.  This will make your symptoms worse. °· Sleep with your head elevated.  Sleeping flat will cause your gums and oral tissues to swell and make them hurt  more.  You can sleep on several pillows.  Even better is to sleep in a recliner with your head higher than your heart. °· For mild to moderate pain, you can take Tylenol, ibuprofen, or Aleve. °· External application of heat by a heating pad, hot water bottle, or hot wet towel can help with pain and speed healing.  You can do this every 2 to 3 hours. Do not fall asleep on a heating pad since this can cause a burn.  °·  °

## 2013-12-09 NOTE — ED Provider Notes (Signed)
Chief Complaint   Dental Pain   History of Present Illness   [name] is a 43 year old female who has widespread dental decay going on for years. The patient attributes this to eating starch as result of anemia. She seeing an oral surgeon who plans to remove multiple teeth in about a week. She was on clindamycin because of dental decay and gingival inflammation. This caused diarrhea and nausea so she's not taking it. Her left upper gingiva is markedly inflamed. She's had a low-grade fever of up to 100. She's also had some headache. It hurts to chew on that side. She denies any external swelling. There is no swelling of the neck. No chest pain or shortness of breath. No trouble breathing or swallowing.  Review of Systems   Other than as noted above, the patient denies any of the following symptoms: Systemic:  No fever or chills. ENT:  No headache, ear ache, sore throat, nasal congestion, facial pain, or swelling. Neck:  No adenopathy or neck swelling. Lungs:  No coughing or shortness of breath.  PMFSH   Past medical history, family history, social history, meds, and allergies were reviewed. She is allergic to morphine and hydrocodone but can take tramadol for pain. She is on iron for her anemia.  Physical Examination     Vital signs:  BP 120/75 mmHg  Pulse 76  Temp(Src) 99.6 F (37.6 C) (Oral)  Resp 16  SpO2 100% General:  Alert, oriented, in no distress. ENT:  TMs and canals normal.  Nasal mucosa normal. Mouth exam:  She had widespread dental decay. All most all of her teeth are either partially or completely decayed. She has multiple missing teeth. All of the left upper rear teeth are decayed down to the gumline. The gingiva is inflamed and tender. There is no collection of pus. No swelling of the floor the mouth of the tongue. The pharynx is clear. Neck:  No swelling or adenopathy. Lungs:  Breath sounds clear and equal bilaterally.  No wheezes, rales or rhonchi. Heart:   Regular rhythm.  No gallops or murmers. Skin:  Clear, warm and dry.   Assessment   The encounter diagnosis was Dental decay.  No evidence of Ludwig's angina.    Plan   1.  Meds:  The following meds were prescribed:   Discharge Medication List as of 12/08/2013 11:17 AM    START taking these medications   Details  amoxicillin (AMOXIL) 500 MG capsule Take 1 capsule (500 mg total) by mouth 3 (three) times daily., Starting 12/08/2013, Until Discontinued, Normal    metroNIDAZOLE (FLAGYL) 500 MG tablet Take 1 tablet (500 mg total) by mouth 3 (three) times daily., Starting 12/08/2013, Until Discontinued, Normal    !! traMADol (ULTRAM) 50 MG tablet Take 2 tablets (100 mg total) by mouth every 8 (eight) hours as needed., Starting 12/08/2013, Until Discontinued, Print     !! - Potential duplicate medications found. Please discuss with provider.      2.  Patient Education/Counseling:  The patient was given appropriate handouts, self care instructions, and instructed in pain control. Suggested sleeping with head of bed elevated and hot salt water mouthwash.   3.  Follow up:  The patient was told to follow up if no better in 3 to 4 days, if becoming worse in any way, and given some red flag symptoms such as difficulty swallowing or breathing which would prompt immediate return.  Follow up with a dentist as soon as posssible.  Reuben Likesavid C Nerida Boivin, MD 12/09/13 [id number]

## 2013-12-27 ENCOUNTER — Emergency Department (INDEPENDENT_AMBULATORY_CARE_PROVIDER_SITE_OTHER)
Admission: EM | Admit: 2013-12-27 | Discharge: 2013-12-27 | Disposition: A | Discharge status: Home / Self Care | Payer: BC Managed Care – PPO | Source: Home / Self Care | Attending: Family Medicine | Admitting: Family Medicine

## 2013-12-27 ENCOUNTER — Encounter (HOSPITAL_COMMUNITY): Payer: Self-pay | Admitting: Emergency Medicine

## 2013-12-27 DIAGNOSIS — R51 Headache: Secondary | ICD-10-CM

## 2013-12-27 DIAGNOSIS — K047 Periapical abscess without sinus: Secondary | ICD-10-CM

## 2013-12-27 DIAGNOSIS — R519 Headache, unspecified: Secondary | ICD-10-CM

## 2013-12-27 MED ORDER — FLUCONAZOLE 150 MG PO TABS: 150.0000 mg | ORAL_TABLET | Freq: Every day | ORAL | Status: DC

## 2013-12-27 MED ORDER — AMOXICILLIN 500 MG PO CAPS: 500.0000 mg | ORAL_CAPSULE | Freq: Three times a day (TID) | ORAL | Status: DC

## 2013-12-27 MED ORDER — TRAMADOL HCL 50 MG PO TABS: 50.0000 mg | ORAL_TABLET | Freq: Four times a day (QID) | ORAL | Status: DC | PRN

## 2013-12-27 MED ORDER — METRONIDAZOLE 500 MG PO TABS: 500.0000 mg | ORAL_TABLET | Freq: Three times a day (TID) | ORAL | Status: DC

## 2013-12-27 NOTE — ED Notes (Signed)
Patient reports she has dental issues and chronic sinus issues.  C/o left side of head pain and pressure, onset 4 days ago

## 2013-12-27 NOTE — Discharge Instructions (Signed)
We have refilled your antibiotics Please take them as prescribed Please follow up with your oral surgeon Please use the yeast medicine as needed

## 2013-12-27 NOTE — ED Provider Notes (Signed)
CSN: [id number]     Arrival date & time 12/27/13  [id number] History   First MD Initiated Contact with Patient 12/27/13 1020     Chief Complaint  Patient presents with  . Oral Swelling  . URI   (Consider location/radiation/quality/duration/timing/severity/associated sxs/prior Treatment) HPI   L cheek pain. Swelling of the cheek. Saw oral surgeon this month and was scheduled for surgery next month. Tried Patent attorneycalling oral surgeon for ABX refill but closed.  Started about 4 days ago. Painful. Worse w/ hot and cold foods. Decreased PO during this time. Denies discharge. H/o dental abscesses. Tramadol for pain w/ improvement.   HA started w/ other symptoms listed above. Typical HA presentation for pt.   Past Medical History  Diagnosis Date  . Anemia     resolved  . Migraine    Past Surgical History  Procedure Laterality Date  . Cesarean section    . Tubal ligation    . Uterine ablation     Family History  Problem Relation Age of Onset  . Hypertension Mother   . Diabetes Mother   . Hypertension Father   . Heart attack Neg Hx   . Hyperlipidemia Neg Hx   . Sudden death Neg Hx    History  Substance Use Topics  . Smoking status: Former Games developermoker  . Smokeless tobacco: Not on file  . Alcohol Use: No     Comment: occasional   OB History    No data available     Review of Systems Per HPI with all other pertinent systems negative.   Allergies  Morphine and related  Home Medications   Prior to Admission medications   Medication Sig Start Date End Date Taking? Authorizing Provider  amoxicillin (AMOXIL) 500 MG capsule Take 1 capsule (500 mg total) by mouth 3 (three) times daily. 12/27/13   Ozella Rocksavid J Ettel Albergo, MD  cyclobenzaprine (FLEXERIL) 5 MG tablet Take 1 tablet (5 mg total) by mouth 3 (three) times daily as needed for muscle spasms. 10/16/13   Hayden Rasmussenavid Mabe, NP  ferrous sulfate 325 (65 FE) MG tablet Take 325 mg by mouth daily with breakfast.      Historical Provider, MD  fluconazole  (DIFLUCAN) 150 MG tablet Take 1 tablet (150 mg total) by mouth daily. Repeat dose in 3 days 12/27/13   Ozella Rocksavid J Rondia Higginbotham, MD  metroNIDAZOLE (FLAGYL) 500 MG tablet Take 1 tablet (500 mg total) by mouth 3 (three) times daily. 12/27/13   Ozella Rocksavid J Miriah Maruyama, MD  ondansetron (ZOFRAN ODT) 4 MG disintegrating tablet 4mg  ODT q4 hours prn nausea/vomit 10/20/13   Robyn M Hess, PA-C  traMADol (ULTRAM) 50 MG tablet Take 1-2 tablets (50-100 mg total) by mouth every 6 (six) hours as needed for moderate pain or severe pain. 12/27/13   Ozella Rocksavid J Rylan Kaufmann, MD   BP 104/64 mmHg  Pulse 107  Temp(Src) 99.4 F (37.4 C) (Oral)  Resp 16  SpO2 99% Physical Exam  Constitutional: She is oriented to person, place, and time. She appears well-developed and well-nourished. No distress.  HENT:  L cheek swelling Numerous dental carries. Ttp.  No purulence or bloody discharge noted.    Eyes: EOM are normal. Pupils are equal, round, and reactive to light.  Neck: Normal range of motion.  Cardiovascular: Normal rate and normal heart sounds.   Pulmonary/Chest: Effort normal and breath sounds normal. She has no wheezes.  Abdominal: Soft. She exhibits no distension.  Musculoskeletal: Normal range of motion. She exhibits no edema or tenderness.  Neurological:  She is alert and oriented to person, place, and time.  Skin: Skin is warm. She is not diaphoretic.  Psychiatric: Her behavior is normal. Judgment normal.    ED Course  Procedures (including critical care time) Labs Review Labs Reviewed - No data to display  Imaging Review No results found.   MDM   1. Dental infection   2. Nonintractable episodic headache, unspecified headache type    Refill AMox/Metro as given at last appt. (Clindamycin makes pt "sick") Tramadol PRN pain HA is typical for pt and will treat w/ tylenol and NSAIDs as needed. Treatment of infection likely to help Diflucan if needed  Precautions given and all questions answered  Shelly Flattenavid Delicia Berens,  MD Family Medicine 12/27/2013, 11:02 AM    Ozella Rocksavid J Adessa Primiano, MD 12/27/13 1102

## 2014-06-01 ENCOUNTER — Emergency Department (INDEPENDENT_AMBULATORY_CARE_PROVIDER_SITE_OTHER)
Admission: EM | Admit: 2014-06-01 | Discharge: 2014-06-01 | Disposition: A | Discharge status: Home / Self Care | Payer: BLUE CROSS/BLUE SHIELD | Source: Home / Self Care | Attending: Family Medicine | Admitting: Family Medicine

## 2014-06-01 ENCOUNTER — Encounter (HOSPITAL_COMMUNITY): Payer: Self-pay

## 2014-06-01 ENCOUNTER — Emergency Department (INDEPENDENT_AMBULATORY_CARE_PROVIDER_SITE_OTHER): Payer: BLUE CROSS/BLUE SHIELD

## 2014-06-01 DIAGNOSIS — S71101A Unspecified open wound, right thigh, initial encounter: Secondary | ICD-10-CM | POA: Diagnosis not present

## 2014-06-01 DIAGNOSIS — S79921A Unspecified injury of right thigh, initial encounter: Secondary | ICD-10-CM

## 2014-06-01 DIAGNOSIS — M25562 Pain in left knee: Secondary | ICD-10-CM

## 2014-06-01 MED ORDER — IBUPROFEN 800 MG PO TABS: 800.0000 mg | ORAL_TABLET | Freq: Three times a day (TID) | ORAL | Status: DC

## 2014-06-01 MED ORDER — TRAMADOL-ACETAMINOPHEN 37.5-325 MG PO TABS: 1.0000 | ORAL_TABLET | Freq: Four times a day (QID) | ORAL | Status: DC | PRN

## 2014-06-01 NOTE — Discharge Instructions (Signed)
Elastic Bandage and RICE °Elastic bandages come in different shapes and sizes. They perform different functions. Your caregiver will help you to decide what is best for your protection, recovery, or rehabilitation following an injury. The following are some general tips to help you use an elastic bandage. °· Use the bandage as directed by the maker of the bandage you are using. °· Do not wrap it too tight. This may cut off the circulation of the arm or leg below the bandage. °· If part of your body beyond the bandage becomes blue, numb, or swollen, it is too tight. Loosen the bandage as needed to prevent these problems. °· See your caregiver or trainer if the bandage seems to be making your problems worse rather than better. °Bandages may be a reminder to you that you have an injury. However, they provide very little support. The few pounds of support they provide are minor considering the pressure it takes to injure a joint or tear ligaments. Therefore, the joint will not be able to handle all of the wear and tear it could before the injury. °The routine care of many injuries includes Rest, Ice, Compression, and Elevation (RICE). °· Rest is required to allow your body to heal. Generally, routine activities can be resumed when comfortable. Injured tendons and bones take about 6 weeks to heal. °· Icing the injury helps keep the swelling down and reduces pain. Do not apply ice directly to the skin. Put ice in a plastic bag. Place a towel between the skin and the bag. This will prevent frostbite to the skin. Apply ice bags to the injured area for 15-20 minutes, every 2 hours while awake. Do this for the first 24 to 48 hours, then as directed by your caregiver. °· Compression helps keep swelling down, gives support, and helps with discomfort. If an elastic bandage has been applied today, it should be removed and reapplied every 3 to 4 hours. It should not be applied tightly, but firmly enough to keep swelling down.  Watch fingers or toes for swelling, bluish discoloration, coldness, numbness, or increased pain. If any of these problems occur, remove the bandage and reapply it more loosely. If these problems persist, contact your caregiver. °· Elevation helps reduce swelling and decreases pain. The injured area (arms, hands, legs, or feet) should be placed near to or above the heart (center of the chest) if able. °Persistent pain and inability to use the injured area for more than 2 to 3 days are warning signs. You should see a caregiver for a follow-up visit as soon as possible. Initially, a minor broken bone (hairline fracture) may not be seen on X-rays. It may take 7 to 10 days to finally show up. Continued pain and swelling show that further evaluation and/or X-rays are needed. Make a follow-up visit with your caregiver. A specialist in reading X-rays (radiologist) will read your X-rays again. °Finding out the results of your test °Not all test results are available during your visit. If your test results are not back during the visit, make an appointment with your caregiver to find out the results. Do not assume everything is normal if you have not heard from your caregiver or the medical facility. It is important for you to follow up on all of your test results. °Document Released: 06/12/2001 Document Revised: 03/15/2011 Document Reviewed: 04/24/2007 °Exit[employer]® Patient Information ©2015 [employer]. This information is not intended to replace advice given to you by your health care provider. Make sure   you discuss any questions you have with your health care provider. ° °

## 2014-06-01 NOTE — ED Provider Notes (Signed)
CSN: [id number]     Arrival date & time 06/01/14  1047 History   First MD Initiated Contact with Patient 06/01/14 1300     Chief Complaint  Patient presents with  . Leg Pain   Patient is a 44 y.o. female presenting with leg pain.  Leg Pain Location:  Leg Time since incident:  2 days Injury: yes   Leg location:  L leg Pain details:    Quality:  Burning   Radiates to:  Does not radiate   Severity:  Moderate   Onset quality:  Sudden   Timing:  Intermittent   Progression:  Partially resolved Chronicity:  New Dislocation: no   Relieved by:  NSAIDs Worsened by:  Bearing weight and adduction Ineffective treatments:  NSAIDs Associated symptoms: swelling   Associated symptoms: no decreased ROM, no fatigue and no muscle weakness   Risk factors: no known bone disorder     Past Medical History  Diagnosis Date  . Anemia     resolved  . Migraine    Past Surgical History  Procedure Laterality Date  . Cesarean section    . Tubal ligation    . Uterine ablation     Family History  Problem Relation Age of Onset  . Hypertension Mother   . Diabetes Mother   . Hypertension Father   . Heart attack Neg Hx   . Hyperlipidemia Neg Hx   . Sudden death Neg Hx    History  Substance Use Topics  . Smoking status: Former Games developer  . Smokeless tobacco: Not on file  . Alcohol Use: No     Comment: occasional   OB History    No data available     Review of Systems  Constitutional: Negative for fatigue.  HENT: Negative for congestion.   Cardiovascular: Positive for leg swelling. Negative for chest pain and palpitations.  Gastrointestinal: Negative for abdominal pain and abdominal distention.  Genitourinary: Negative.   Musculoskeletal: Positive for myalgias, joint swelling and arthralgias. Negative for neck stiffness.  Neurological: Negative for dizziness.    Allergies  Morphine and related  Home Medications   Prior to Admission medications   Medication Sig Start Date End Date  Taking? Authorizing Provider  ferrous sulfate 325 (65 FE) MG tablet Take 325 mg by mouth daily with breakfast.      Historical Provider, MD  ibuprofen (ADVIL,MOTRIN) 800 MG tablet Take 1 tablet (800 mg total) by mouth 3 (three) times daily. 06/01/14   Ofilia Neas, PA-C  traMADol-acetaminophen (ULTRACET) 37.5-325 MG per tablet Take 1 tablet by mouth every 6 (six) hours as needed. 06/01/14   Ofilia Neas, PA-C   BP 126/70 mmHg  Pulse 97  Temp(Src) 99.2 F (37.3 C) (Oral)  Resp 16  SpO2 100% Physical Exam  Constitutional: She is oriented to person, place, and time. She appears well-developed and well-nourished. No distress.  Cardiovascular: Normal rate.   Pulmonary/Chest: Effort normal.  Musculoskeletal:       Left knee: She exhibits normal range of motion, no swelling, no effusion, no ecchymosis, no deformity, normal alignment, no bony tenderness, normal meniscus and no MCL laxity. Tenderness found. Medial joint line and lateral joint line tenderness noted. No MCL and no LCL tenderness noted.       Legs: Neurological: She is alert and oriented to person, place, and time.  Skin: Skin is warm and dry. She is not diaphoretic.  Psychiatric: She has a normal mood and affect. Her behavior is normal.  Nursing  note and vitals reviewed.   ED Course  Procedures (including critical care time) Labs Review Labs Reviewed - No data to display  Imaging Review Dg Knee Complete 4 Views Left  06/01/2014   CLINICAL DATA:  Pt dislocates her knee sometimes, and 2 days ago she was at the gym and it popped out and then back in, pt has a lot of swelling and pain, pt also plays a lot of sports  EXAM: LEFT KNEE - COMPLETE 4+ VIEW  COMPARISON:  06/03/2011  FINDINGS: There is no evidence of fracture, dislocation, or joint effusion. There is no evidence of arthropathy or other focal bone abnormality. Soft tissues are unremarkable.  IMPRESSION: Negative.   Electronically Signed   By: Amie Portlandavid  Ormond M.D.   On:  06/01/2014 13:43     MDM   1. Left knee pain   2. Soft tissue injury of thigh, right, initial encounter    Patient with knee pain and strain of the thigh. Knee exam and radiograph reassuring.  Will treat symptomaticaly with Ultracet and ibuprofen.     Ofilia NeasMichael L Jamiah Recore, PA-C 06/01/14 1410

## 2014-06-01 NOTE — ED Notes (Signed)
Left leg and knee pain x 2 days. Pt was exercising and noticed the pain

## 2014-06-06 ENCOUNTER — Encounter (HOSPITAL_COMMUNITY): Payer: Self-pay | Admitting: Emergency Medicine

## 2014-06-06 ENCOUNTER — Emergency Department (INDEPENDENT_AMBULATORY_CARE_PROVIDER_SITE_OTHER)
Admission: EM | Admit: 2014-06-06 | Discharge: 2014-06-06 | Disposition: A | Discharge status: Home / Self Care | Payer: BLUE CROSS/BLUE SHIELD | Source: Home / Self Care | Attending: Family Medicine | Admitting: Family Medicine

## 2014-06-06 DIAGNOSIS — S76112D Strain of left quadriceps muscle, fascia and tendon, subsequent encounter: Secondary | ICD-10-CM | POA: Diagnosis not present

## 2014-06-06 MED ORDER — DICLOFENAC SODIUM 75 MG PO TBEC: 75.0000 mg | DELAYED_RELEASE_TABLET | Freq: Two times a day (BID) | ORAL | Status: DC

## 2014-06-06 NOTE — Discharge Instructions (Signed)
The muscle strain you have incurred to your left quad and inner thigh muscles likely take several weeks to fully resolve. Memorette pain be her guide with regards to return to physical activity. Stop the Ultracet. Stop ibuprofen. Start the Voltaren for pain and inflammation. Memory II apply heat and gentle massage to the area multiple times per day. Remember to do gentle exercises that will strengthen the quadriceps and thigh abductor muscles. Please follow-up with your primary care physician or sports medicine for further needs.

## 2014-06-06 NOTE — ED Notes (Signed)
Left knee issue, seen 5/28.  Since being seen, swelling has reduced, pain has decreased minimally.  Unable to take ultracet, has nausea, no vomiting, bloating, and has had diarrhea-feels these symptoms related to [employer]

## 2014-06-06 NOTE — ED Provider Notes (Signed)
CSN: [id number]     Arrival date & time 06/06/14  0935 History   First MD Initiated Contact with Patient 06/06/14 [id number]     Chief Complaint  Patient presents with  . Knee Pain  . Medication Reaction   (Consider location/radiation/quality/duration/timing/severity/associated sxs/prior Treatment) HPI  7 days ago patient incurred an acute onset of left medial thigh pain while doing 25 pounds leg presses. Patient states that her pain has been fairly constant since this point in time but is worsened with certain movements. Patient seen in the ED 5 days ago and prescribed Ultracet and ibuprofen 800 mg. Patient states that the Ultracet makes her feel poorly. Overall symptoms seem to be getting a little bit better but patient states that her leg still remains swollen in the area of the pain. Denies any diffuse lower extremity swelling, chest pain, shortness of breath, palpitations, fevers, other joint involvement. Patient does not smoke and is not on birth control pills. Patient played numerous sports growing up and has had various knee injuries over the years.   Past Medical History  Diagnosis Date  . Anemia     resolved  . Migraine    Past Surgical History  Procedure Laterality Date  . Cesarean section    . Tubal ligation    . Uterine ablation     Family History  Problem Relation Age of Onset  . Hypertension Mother   . Diabetes Mother   . Hypertension Father   . Heart attack Neg Hx   . Hyperlipidemia Neg Hx   . Sudden death Neg Hx    History  Substance Use Topics  . Smoking status: Former Games developermoker  . Smokeless tobacco: Not on file  . Alcohol Use: No     Comment: occasional   OB History    No data available     Review of Systems Per HPI with all other pertinent systems negative.   Allergies  Morphine and related  Home Medications   Prior to Admission medications   Medication Sig Start Date End Date Taking? Authorizing Provider  ferrous sulfate 325 (65 FE) MG tablet Take  325 mg by mouth daily with breakfast.     Yes Historical Provider, MD  ibuprofen (ADVIL,MOTRIN) 800 MG tablet Take 1 tablet (800 mg total) by mouth 3 (three) times daily. 06/01/14  Yes Ofilia NeasMichael L Clark, PA-C  diclofenac (VOLTAREN) 75 MG EC tablet Take 1 tablet (75 mg total) by mouth 2 (two) times daily. 06/06/14   Ozella Rocksavid J Crystal Ellwood, MD   BP 128/71 mmHg  Pulse 80  Temp(Src) 99 F (37.2 C) (Oral)  Resp 16  SpO2 100% Physical Exam Physical Exam  Constitutional: oriented to person, place, and time. appears well-developed and well-nourished. No distress.  HENT:  Head: Normocephalic and atraumatic.  Eyes: EOMI. PERRL.  Neck: Normal range of motion.  Cardiovascular: RRR, no m/r/g, 2+ distal pulses,  Pulmonary/Chest: Effort normal and breath sounds normal. No respiratory distress.  Abdominal: Soft. Bowel sounds are normal. NonTTP, no distension.  Musculoskeletal: Left inner thigh mildly and intermittently tender to palpation just proximal to the knee joint. No appreciable swelling, bruising, or induration.  Neurological: alert and oriented to person, place, and time.  Skin: Skin is warm. No rash noted. non diaphoretic.  Psychiatric: normal mood and affect. behavior is normal. Judgment and thought content normal.   ED Course  Procedures (including critical care time) Labs Review Labs Reviewed - No data to display  Imaging Review No results found.  MDM   1. Quadriceps strain, left, subsequent encounter    Ultracet and ibuprofen. Start Voltaren. Motor detailed quadriceps and abductor muscle exercises. Patient states she is done poorly on muscle relaxers in the past and will forego these. Recommended heat, massage, and expectations set as this may take several weeks to resolve. Follow-up with with [facility] as previously designated if symptoms continue. No evidence of DVT at this time.    Ozella Rocks, MD 06/06/14 1022

## 2014-06-07 ENCOUNTER — Telehealth: Payer: Self-pay

## 2014-06-07 ENCOUNTER — Other Ambulatory Visit: Payer: Self-pay | Admitting: Physician Assistant

## 2014-06-07 MED ORDER — TRAMADOL-ACETAMINOPHEN 37.5-325 MG PO TABS: 1.0000 | ORAL_TABLET | Freq: Four times a day (QID) | ORAL | Status: DC | PRN

## 2014-06-07 NOTE — Telephone Encounter (Signed)
Erin NeedleMichael do you recall? [facility]

## 2014-06-07 NOTE — Progress Notes (Signed)
Patient requesting more Ultracet for soft tissue injury diagnosed 1 week ago.  Will refill once.  She has an orthopedic appointment in roughly 11 days.  Deliah BostonMichael Clark, MS, PA-C   7:41 PM, 06/07/2014

## 2014-06-07 NOTE — Telephone Encounter (Signed)
Patient was seen by Deliah BostonMichael Clark, PA-C at [facility] when he was filling in over there. She was told to call back if any questions. She was prescribed Ultracet, a medication she has never taken before and it didn't work well. She wants to speak with him about this. Cb# [phone].

## 2014-08-12 ENCOUNTER — Emergency Department (HOSPITAL_BASED_OUTPATIENT_CLINIC_OR_DEPARTMENT_OTHER): Payer: BLUE CROSS/BLUE SHIELD

## 2014-08-12 ENCOUNTER — Emergency Department (HOSPITAL_BASED_OUTPATIENT_CLINIC_OR_DEPARTMENT_OTHER)
Admission: EM | Admit: 2014-08-12 | Discharge: 2014-08-12 | Disposition: A | Discharge status: Home / Self Care | Payer: BLUE CROSS/BLUE SHIELD | Attending: Emergency Medicine | Admitting: Emergency Medicine

## 2014-08-12 ENCOUNTER — Encounter (HOSPITAL_BASED_OUTPATIENT_CLINIC_OR_DEPARTMENT_OTHER): Payer: Self-pay | Admitting: *Deleted

## 2014-08-12 DIAGNOSIS — Z8679 Personal history of other diseases of the circulatory system: Secondary | ICD-10-CM | POA: Insufficient documentation

## 2014-08-12 DIAGNOSIS — Z113 Encounter for screening for infections with a predominantly sexual mode of transmission: Secondary | ICD-10-CM | POA: Diagnosis not present

## 2014-08-12 DIAGNOSIS — N8320 Unspecified ovarian cysts: Secondary | ICD-10-CM | POA: Diagnosis not present

## 2014-08-12 DIAGNOSIS — Z79899 Other long term (current) drug therapy: Secondary | ICD-10-CM | POA: Diagnosis not present

## 2014-08-12 DIAGNOSIS — Z792 Long term (current) use of antibiotics: Secondary | ICD-10-CM | POA: Diagnosis not present

## 2014-08-12 DIAGNOSIS — D649 Anemia, unspecified: Secondary | ICD-10-CM | POA: Insufficient documentation

## 2014-08-12 DIAGNOSIS — Z711 Person with feared health complaint in whom no diagnosis is made: Secondary | ICD-10-CM

## 2014-08-12 DIAGNOSIS — N898 Other specified noninflammatory disorders of vagina: Secondary | ICD-10-CM

## 2014-08-12 DIAGNOSIS — N730 Acute parametritis and pelvic cellulitis: Secondary | ICD-10-CM | POA: Insufficient documentation

## 2014-08-12 DIAGNOSIS — R1032 Left lower quadrant pain: Secondary | ICD-10-CM | POA: Diagnosis present

## 2014-08-12 DIAGNOSIS — R1030 Lower abdominal pain, unspecified: Secondary | ICD-10-CM

## 2014-08-12 DIAGNOSIS — N83202 Unspecified ovarian cyst, left side: Secondary | ICD-10-CM

## 2014-08-12 LAB — CBC WITH DIFFERENTIAL/PLATELET
BASOS PCT: 1 % (ref 0–1)
Basophils Absolute: 0 10*3/uL (ref 0.0–0.1)
EOS PCT: 2 % (ref 0–5)
Eosinophils Absolute: 0.1 10*3/uL (ref 0.0–0.7)
HEMATOCRIT: 38.8 % (ref 36.0–46.0)
Hemoglobin: 12.1 g/dL (ref 12.0–15.0)
Lymphocytes Relative: 45 % (ref 12–46)
Lymphs Abs: 1.6 10*3/uL (ref 0.7–4.0)
MCH: 24.6 pg — AB (ref 26.0–34.0)
MCHC: 31.2 g/dL (ref 30.0–36.0)
MCV: 79 fL (ref 78.0–100.0)
MONOS PCT: 14 % — AB (ref 3–12)
Monocytes Absolute: 0.5 10*3/uL (ref 0.1–1.0)
NEUTROS ABS: 1.4 10*3/uL — AB (ref 1.7–7.7)
NEUTROS PCT: 38 % — AB (ref 43–77)
PLATELETS: 230 10*3/uL (ref 150–400)
RBC: 4.91 MIL/uL (ref 3.87–5.11)
RDW: 14.1 % (ref 11.5–15.5)
WBC: 3.6 10*3/uL — AB (ref 4.0–10.5)

## 2014-08-12 LAB — URINALYSIS, ROUTINE W REFLEX MICROSCOPIC
Glucose, UA: NEGATIVE mg/dL
Hgb urine dipstick: NEGATIVE
Ketones, ur: 15 mg/dL — AB
Leukocytes, UA: NEGATIVE
NITRITE: NEGATIVE
PROTEIN: NEGATIVE mg/dL
Specific Gravity, Urine: 1.042 — ABNORMAL HIGH (ref 1.005–1.030)
UROBILINOGEN UA: 1 mg/dL (ref 0.0–1.0)
pH: 7 (ref 5.0–8.0)

## 2014-08-12 LAB — BASIC METABOLIC PANEL
Anion gap: 8 (ref 5–15)
BUN: 9 mg/dL (ref 6–20)
CO2: 27 mmol/L (ref 22–32)
Calcium: 9.2 mg/dL (ref 8.9–10.3)
Chloride: 106 mmol/L (ref 101–111)
Creatinine, Ser: 0.62 mg/dL (ref 0.44–1.00)
GFR calc Af Amer: >60 mL/min (ref >60)
Glucose, Bld: 98 mg/dL (ref 65–99)
POTASSIUM: 3.7 mmol/L (ref 3.5–5.1)
SODIUM: 141 mmol/L (ref 135–145)

## 2014-08-12 LAB — WET PREP, GENITAL
Trich, Wet Prep: NONE SEEN
YEAST WET PREP: NONE SEEN

## 2014-08-12 LAB — PREGNANCY, URINE: Preg Test, Ur: NEGATIVE

## 2014-08-12 MED ORDER — IOHEXOL 300 MG/ML  SOLN
25.0000 mL | Freq: Once | INTRAMUSCULAR | Status: AC | PRN
  Administered 2014-08-12: 25 mL via ORAL

## 2014-08-12 MED ORDER — DOXYCYCLINE HYCLATE 100 MG PO CAPS: 100.0000 mg | ORAL_CAPSULE | Freq: Two times a day (BID) | ORAL | Status: DC

## 2014-08-12 MED ORDER — CEFTRIAXONE SODIUM 1 G IJ SOLR
INTRAMUSCULAR | Status: AC
  Filled 2014-08-12: qty 10

## 2014-08-12 MED ORDER — CEFTRIAXONE SODIUM 1 G IJ SOLR
1.0000 g | Freq: Once | INTRAMUSCULAR | Status: AC
  Administered 2014-08-12: 1 g via INTRAVENOUS

## 2014-08-12 MED ORDER — IOHEXOL 300 MG/ML  SOLN
100.0000 mL | Freq: Once | INTRAMUSCULAR | Status: AC | PRN
  Administered 2014-08-12: 100 mL via INTRAVENOUS

## 2014-08-12 MED ORDER — AZITHROMYCIN 250 MG PO TABS
1000.0000 mg | ORAL_TABLET | Freq: Once | ORAL | Status: AC
  Administered 2014-08-12: 1000 mg via ORAL
  Filled 2014-08-12: qty 4

## 2014-08-12 MED ORDER — TRAMADOL HCL 50 MG PO TABS: 50.0000 mg | ORAL_TABLET | Freq: Four times a day (QID) | ORAL | Status: DC | PRN

## 2014-08-12 NOTE — Discharge Instructions (Signed)
1. Medications: tramadol, doxycycline usual home medications 2. Treatment: rest, drink plenty of fluids, advance diet slowly 3. Follow Up: Please followup with your primary doctor in 2 days for discussion of your diagnoses and further evaluation after today's visit; if you do not have a primary care doctor use the resource guide provided to find one; Please return to the ER for persistent vomiting, high fevers or worsening symptoms    Ovarian Cyst An ovarian cyst is a fluid-filled sac that forms on an ovary. The ovaries are small organs that produce eggs in women. Various types of cysts can form on the ovaries. Most are not cancerous. Many do not cause problems, and they often go away on their own. Some may cause symptoms and require treatment. Common types of ovarian cysts include:  Functional cysts--These cysts may occur every month during the menstrual cycle. This is normal. The cysts usually go away with the next menstrual cycle if the woman does not get pregnant. Usually, there are no symptoms with a functional cyst.  Endometrioma cysts--These cysts form from the tissue that lines the uterus. They are also called "chocolate cysts" because they become filled with blood that turns brown. This type of cyst can cause pain in the lower abdomen during intercourse and with your menstrual period.  Cystadenoma cysts--This type develops from the cells on the outside of the ovary. These cysts can get very big and cause lower abdomen pain and pain with intercourse. This type of cyst can twist on itself, cut off its blood supply, and cause severe pain. It can also easily rupture and cause a lot of pain.  Dermoid cysts--This type of cyst is sometimes found in both ovaries. These cysts may contain different kinds of body tissue, such as skin, teeth, hair, or cartilage. They usually do not cause symptoms unless they get very big.  Theca lutein cysts--These cysts occur when too much of a certain hormone (human  chorionic gonadotropin) is produced and overstimulates the ovaries to produce an egg. This is most common after procedures used to assist with the conception of a baby (in vitro fertilization). CAUSES   Fertility drugs can cause a condition in which multiple large cysts are formed on the ovaries. This is called ovarian hyperstimulation syndrome.  A condition called polycystic ovary syndrome can cause hormonal imbalances that can lead to nonfunctional ovarian cysts. SIGNS AND SYMPTOMS  Many ovarian cysts do not cause symptoms. If symptoms are present, they may include:  Pelvic pain or pressure.  Pain in the lower abdomen.  Pain during sexual intercourse.  Increasing girth (swelling) of the abdomen.  Abnormal menstrual periods.  Increasing pain with menstrual periods.  Stopping having menstrual periods without being pregnant. DIAGNOSIS  These cysts are commonly found during a routine or annual pelvic exam. Tests may be ordered to find out more about the cyst. These tests may include:  Ultrasound.  X-ray of the pelvis.  CT scan.  MRI.  Blood tests. TREATMENT  Many ovarian cysts go away on their own without treatment. Your health care provider may want to check your cyst regularly for 2-3 months to see if it changes. For women in menopause, it is particularly important to monitor a cyst closely because of the higher rate of ovarian cancer in menopausal women. When treatment is needed, it may include any of the following:  A procedure to drain the cyst (aspiration). This may be done using a long needle and ultrasound. It can also be done through a  laparoscopic procedure. This involves using a thin, lighted tube with a tiny camera on the end (laparoscope) inserted through a small incision.  Surgery to remove the whole cyst. This may be done using laparoscopic surgery or an open surgery involving a larger incision in the lower abdomen.  Hormone treatment or birth control pills.  These methods are sometimes used to help dissolve a cyst. HOME CARE INSTRUCTIONS   Only take over-the-counter or prescription medicines as directed by your health care provider.  Follow up with your health care provider as directed.  Get regular pelvic exams and Pap tests. SEEK MEDICAL CARE IF:   Your periods are late, irregular, or painful, or they stop.  Your pelvic pain or abdominal pain does not go away.  Your abdomen becomes larger or swollen.  You have pressure on your bladder or trouble emptying your bladder completely.  You have pain during sexual intercourse.  You have feelings of fullness, pressure, or discomfort in your stomach.  You lose weight for no apparent reason.  You feel generally ill.  You become constipated.  You lose your appetite.  You develop acne.  You have an increase in body and facial hair.  You are gaining weight, without changing your exercise and eating habits.  You think you are pregnant. SEEK IMMEDIATE MEDICAL CARE IF:   You have increasing abdominal pain.  You feel sick to your stomach (nauseous), and you throw up (vomit).  You develop a fever that comes on suddenly.  You have abdominal pain during a bowel movement.  Your menstrual periods become heavier than usual. MAKE SURE YOU:  Understand these instructions.  Will watch your condition.  Will get help right away if you are not doing well or get worse. Document Released: 12/21/2004 Document Revised: 12/26/2012 Document Reviewed: 08/28/2012 [facility] Patient Information 2015 [place], [place]. This information is not intended to replace advice given to you by your health care provider. Make sure you discuss any questions you have with your health care provider.

## 2014-08-12 NOTE — ED Provider Notes (Signed)
CSN: [id number]     Arrival date & time 08/12/14  4098 History   First MD Initiated Contact with Patient 08/12/14 0935     Chief Complaint  Patient presents with  . Abdominal Pain     (Consider location/radiation/quality/duration/timing/severity/associated sxs/prior Treatment) The history is provided by the patient and medical records. No language interpreter was used.     [name] is a 44 y.o. female  with a hx of anemia, migraine, ovarian cyst presents to the Emergency Department complaining of gradual, persistent, progressively worsening bilateral pelvic pain onset 1 week ago. Pain is worst in the LLQ, rated an a 8/10 and described as achy.  Associated symptoms include dark vaginal discharge and 1 episode of loose stool this AM without melena or hematochezia.  Pt reports she is sexually active with 1 female partner in a monogamous marriage.  She reports pain with sexual intercourse 3 days ago and then development of the discharge.  Pt reports Hx of uterine ablation 4 years ago and therefore does not menstruate.  Pt reports taking tramadol, hydrocodone and ibuprofen without relief.  She reports pain is similar to previous ovarian cyst.  Pt denies fever, chills, headache, neck pain, chest pain, shortness of breath, nausea, vomiting, weakness, dizziness, syncope, dysuria, hematuria.     Past Medical History  Diagnosis Date  . Anemia     resolved  . Migraine    Past Surgical History  Procedure Laterality Date  . Cesarean section    . Tubal ligation    . Uterine ablation     Family History  Problem Relation Age of Onset  . Hypertension Mother   . Diabetes Mother   . Hypertension Father   . Heart attack Neg Hx   . Hyperlipidemia Neg Hx   . Sudden death Neg Hx    History  Substance Use Topics  . Smoking status: Former Games developer  . Smokeless tobacco: Not on file  . Alcohol Use: No     Comment: occasional   OB History    No data available     Review of Systems   Constitutional: Negative for fever, diaphoresis, appetite change, fatigue and unexpected weight change.  HENT: Negative for mouth sores.   Eyes: Negative for visual disturbance.  Respiratory: Negative for cough, chest tightness, shortness of breath and wheezing.   Cardiovascular: Negative for chest pain.  Gastrointestinal: Positive for abdominal pain. Negative for nausea, vomiting, diarrhea and constipation.  Endocrine: Negative for polydipsia, polyphagia and polyuria.  Genitourinary: Positive for vaginal discharge and pelvic pain. Negative for dysuria, urgency, frequency and hematuria.  Musculoskeletal: Negative for back pain and neck stiffness.  Skin: Negative for rash.  Allergic/Immunologic: Negative for immunocompromised state.  Neurological: Negative for syncope, light-headedness and headaches.  Hematological: Does not bruise/bleed easily.  Psychiatric/Behavioral: Negative for sleep disturbance. The patient is not nervous/anxious.       Allergies  Morphine and related  Home Medications   Prior to Admission medications   Medication Sig Start Date End Date Taking? Authorizing Provider  diclofenac (VOLTAREN) 75 MG EC tablet Take 1 tablet (75 mg total) by mouth 2 (two) times daily. 06/06/14   Ozella Rocks, MD  doxycycline (VIBRAMYCIN) 100 MG capsule Take 1 capsule (100 mg total) by mouth 2 (two) times daily. 08/12/14   Bolden Hagerman, PA-C  ferrous sulfate 325 (65 FE) MG tablet Take 325 mg by mouth daily with breakfast.      Historical Provider, MD  ibuprofen (ADVIL,MOTRIN) 800 MG tablet Take  1 tablet (800 mg total) by mouth 3 (three) times daily. 06/01/14   Ofilia Neas, PA-C  traMADol (ULTRAM) 50 MG tablet Take 1 tablet (50 mg total) by mouth every 6 (six) hours as needed. 08/12/14   Tabria Steines, PA-C  traMADol-acetaminophen (ULTRACET) 37.5-325 MG per tablet Take 1 tablet by mouth every 6 (six) hours as needed. 06/07/14   Ofilia Neas, PA-C   BP 127/63 mmHg  Pulse  62  Temp(Src) 98.2 F (36.8 C) (Oral)  Resp 16  Ht  (1.778 m)  Wt 175 lb (79.379 kg)  BMI 25.11 kg/m2  SpO2 100% Physical Exam  Constitutional: She appears well-developed and well-nourished. No distress.  Awake, alert, nontoxic appearance  HENT:  Head: Normocephalic and atraumatic.  Mouth/Throat: Oropharynx is clear and moist. No oropharyngeal exudate.  Eyes: Conjunctivae are normal. No scleral icterus.  Neck: Normal range of motion. Neck supple.  Cardiovascular: Normal rate, regular rhythm, normal heart sounds and intact distal pulses.   No murmur heard. Pulmonary/Chest: Effort normal and breath sounds normal. No respiratory distress. She has no wheezes.  Equal chest expansion  Abdominal: Soft. Bowel sounds are normal. She exhibits no mass. There is tenderness in the right lower quadrant, suprapubic area and left lower quadrant. There is guarding. There is no rebound and no CVA tenderness. Hernia confirmed negative in the right inguinal area and confirmed negative in the left inguinal area.  Genitourinary: Uterus normal. No labial fusion. There is no rash, tenderness or lesion on the right labia. There is no rash, tenderness or lesion on the left labia. Uterus is not deviated, not enlarged, not fixed and not tender. Cervix exhibits motion tenderness. Cervix exhibits no friability. Right adnexum displays tenderness. Right adnexum displays no mass and no fullness. Left adnexum displays tenderness. Left adnexum displays no mass and no fullness. No erythema, tenderness or bleeding in the vagina. No foreign body around the vagina. No signs of injury around the vagina. Vaginal discharge (thick, green/gray, nonodorous) found.  Bilateral adnexa tenderness with L significantly worse than right.   CMT  Musculoskeletal: Normal range of motion. She exhibits no edema.  Lymphadenopathy:       Right: No inguinal adenopathy present.       Left: No inguinal adenopathy present.  Neurological: She is  alert.  Speech is clear and goal oriented Moves extremities without ataxia  Skin: Skin is warm and dry. She is not diaphoretic. No erythema.  Psychiatric: She has a normal mood and affect.  Nursing note and vitals reviewed.   ED Course  Procedures (including critical care time) Labs Review Labs Reviewed  WET PREP, GENITAL - Abnormal; Notable for the following:    Clue Cells Wet Prep HPF POC FEW (*)    WBC, Wet Prep HPF POC TOO NUMEROUS TO COUNT (*)    All other components within normal limits  URINALYSIS, ROUTINE W REFLEX MICROSCOPIC (NOT AT [facility]) - Abnormal; Notable for the following:    Color, Urine AMBER (*)    Specific Gravity, Urine 1.042 (*)    Bilirubin Urine SMALL (*)    Ketones, ur 15 (*)    All other components within normal limits  CBC WITH DIFFERENTIAL/PLATELET - Abnormal; Notable for the following:    WBC 3.6 (*)    MCH 24.6 (*)    Neutrophils Relative % 38 (*)    Neutro Abs 1.4 (*)    Monocytes Relative 14 (*)    All other components within normal limits  PREGNANCY,  URINE  BASIC METABOLIC PANEL  GC/CHLAMYDIA PROBE AMP ([place]) NOT AT [facility]    Imaging Review Ct Abdomen Pelvis W Contrast  08/12/2014   CLINICAL DATA:  Bilateral pelvic pain for 1 week  EXAM: CT ABDOMEN AND PELVIS WITH CONTRAST  TECHNIQUE: Multidetector CT imaging of the abdomen and pelvis was performed using the standard protocol following bolus administration of intravenous contrast.  CONTRAST:  25mL OMNIPAQUE IOHEXOL 300 MG/ML SOLN, OMNIPAQUE IOHEXOL 300 MG/ML SOLN  COMPARISON:  CT scan 11/14/2007  FINDINGS: Lung bases are unremarkable. Sagittal images of the spine are unremarkable. Liver, pancreas, spleen and adrenal glands are unremarkable. No aortic aneurysm. Kidneys are symmetrical in size and enhancement. No hydronephrosis or hydroureter. No focal renal mass.  Delayed renal images shows bilateral renal symmetrical excretion. Bilateral visualized proximal ureter is unremarkable. Moderate  stool noted in right colon. Normal appendix. No pericecal inflammation. There is fecal like material in terminal ileum most likely incompetent ileocecal valve. No small bowel obstruction. No ascites or free air. No adenopathy. There is a hemorrhagic cyst/ follicle in left ovary measures 2.2 cm. Small amount of free fluid noted posterior cul-de-sac. The uterus is anteflexed normal size. No inguinal adenopathy. Anterior pelvic wall scarring is noted. No inguinal adenopathy. No destructive bony lesions are noted within pelvis.  IMPRESSION: 1. No small bowel obstruction.  No hydronephrosis or hydroureter. 2. Moderate stool noted in right colon. No pericecal inflammation. Normal appendix. 3. Fecal like material within terminal ileum probable incompetent ileocecal valve. 4. There is a hemorrhagic cyst/follicle within left ovary measures 2.2 cm. Small amount of free fluid noted within posterior cul-de-sac   Electronically Signed   By: Natasha Mead M.D.   On: 08/12/2014 11:10     EKG Interpretation None      MDM   Final diagnoses:  Lower abdominal pain  Cyst of left ovary  Vaginal discharge  PID (acute pelvic inflammatory disease)  Concern about STD in female without diagnosis   Shir Bergman presents with lower abdominal pain worst in the left lower quadrant.  Pelvic exam with nonodorous, gray vaginal discharge, cervical motion tenderness and left adnexal tenderness.  Urinalysis without evidence of urinary tract infection. Wet prep with too numerous to count white blood cells but no evidence of Trichomonas, BV or yeast. Will obtain CT scan to rule out tubo-ovarian abscess, diverticulitis or ruptured ovarian cyst.  Based on patient's length of symptoms, highly doubt ovarian torsion.    11:52 AM CT with hemorrhagic cyst of the left ovary; this is likely the cause of patient's pain.  Patient given Rocephin and azithromycin to treat for potential STD as she has cervical motion tenderness and discharge.  Patient was discharged home with doxycycline. She requests tramadol for her abdominal pain.   OB/GYN follow-up in 1 week.    BP 127/63 mmHg  Pulse 62  Temp(Src) 98.2 F (36.8 C) (Oral)  Resp 16  Ht 5\' 10"  (1.778 m)  Wt 175 lb (79.379 kg)  BMI 25.11 kg/m2  SpO2 100%    Dierdre Forth, PA-C 08/12/14 1157  Mirian Mo, MD 08/14/14 2251

## 2014-08-12 NOTE — ED Notes (Addendum)
Pt amb to room 1 with quick steady gait in nad. Pt reports pelvic pain x 1 week, pt states she has had an ovarian cyst in the past and this feels the same, but is on both sides of her pelvis. Pt reports small amt vag discharge, "dark" in color a few days ago, none since. Denies any n/v/d or fevers, last bm at 4am.

## 2014-08-13 LAB — GC/CHLAMYDIA PROBE AMP (~~LOC~~) NOT AT ARMC
CHLAMYDIA, DNA PROBE: NEGATIVE
Neisseria Gonorrhea: NEGATIVE

## 2014-08-15 ENCOUNTER — Emergency Department (HOSPITAL_COMMUNITY): Payer: BLUE CROSS/BLUE SHIELD

## 2014-08-15 ENCOUNTER — Emergency Department (HOSPITAL_COMMUNITY)
Admission: EM | Admit: 2014-08-15 | Discharge: 2014-08-15 | Disposition: A | Discharge status: Home / Self Care | Payer: BLUE CROSS/BLUE SHIELD | Attending: Emergency Medicine | Admitting: Emergency Medicine

## 2014-08-15 ENCOUNTER — Encounter (HOSPITAL_COMMUNITY): Payer: Self-pay | Admitting: *Deleted

## 2014-08-15 DIAGNOSIS — N830 Follicular cyst of ovary: Secondary | ICD-10-CM | POA: Insufficient documentation

## 2014-08-15 DIAGNOSIS — G43909 Migraine, unspecified, not intractable, without status migrainosus: Secondary | ICD-10-CM | POA: Diagnosis not present

## 2014-08-15 DIAGNOSIS — Z792 Long term (current) use of antibiotics: Secondary | ICD-10-CM | POA: Diagnosis not present

## 2014-08-15 DIAGNOSIS — R102 Pelvic and perineal pain unspecified side: Secondary | ICD-10-CM

## 2014-08-15 DIAGNOSIS — N83202 Unspecified ovarian cyst, left side: Secondary | ICD-10-CM

## 2014-08-15 DIAGNOSIS — Z79899 Other long term (current) drug therapy: Secondary | ICD-10-CM | POA: Diagnosis not present

## 2014-08-15 DIAGNOSIS — Z87891 Personal history of nicotine dependence: Secondary | ICD-10-CM | POA: Diagnosis not present

## 2014-08-15 DIAGNOSIS — R1032 Left lower quadrant pain: Secondary | ICD-10-CM

## 2014-08-15 DIAGNOSIS — R1030 Lower abdominal pain, unspecified: Secondary | ICD-10-CM | POA: Diagnosis present

## 2014-08-15 LAB — URINALYSIS, ROUTINE W REFLEX MICROSCOPIC
Bilirubin Urine: NEGATIVE
GLUCOSE, UA: NEGATIVE mg/dL
Hgb urine dipstick: NEGATIVE
Ketones, ur: NEGATIVE mg/dL
LEUKOCYTES UA: NEGATIVE
Nitrite: NEGATIVE
Protein, ur: NEGATIVE mg/dL
Specific Gravity, Urine: 1.023 (ref 1.005–1.030)
Urobilinogen, UA: 1 mg/dL (ref 0.0–1.0)
pH: 7.5 (ref 5.0–8.0)

## 2014-08-15 MED ORDER — TRAMADOL HCL 50 MG PO TABS: 50.0000 mg | ORAL_TABLET | Freq: Four times a day (QID) | ORAL | Status: DC | PRN

## 2014-08-15 NOTE — Discharge Instructions (Signed)
Ovarian Cyst An ovarian cyst is a fluid-filled sac that forms on an ovary. The ovaries are small organs that produce eggs in women. Various types of cysts can form on the ovaries. Most are not cancerous. Many do not cause problems, and they often go away on their own. Some may cause symptoms and require treatment. Common types of ovarian cysts include:  Functional cysts--These cysts may occur every month during the menstrual cycle. This is normal. The cysts usually go away with the next menstrual cycle if the woman does not get pregnant. Usually, there are no symptoms with a functional cyst.  Endometrioma cysts--These cysts form from the tissue that lines the uterus. They are also called "chocolate cysts" because they become filled with blood that turns brown. This type of cyst can cause pain in the lower abdomen during intercourse and with your menstrual period.  Cystadenoma cysts--This type develops from the cells on the outside of the ovary. These cysts can get very big and cause lower abdomen pain and pain with intercourse. This type of cyst can twist on itself, cut off its blood supply, and cause severe pain. It can also easily rupture and cause a lot of pain.  Dermoid cysts--This type of cyst is sometimes found in both ovaries. These cysts may contain different kinds of body tissue, such as skin, teeth, hair, or cartilage. They usually do not cause symptoms unless they get very big.  Theca lutein cysts--These cysts occur when too much of a certain hormone (human chorionic gonadotropin) is produced and overstimulates the ovaries to produce an egg. This is most common after procedures used to assist with the conception of a baby (in vitro fertilization). CAUSES   Fertility drugs can cause a condition in which multiple large cysts are formed on the ovaries. This is called ovarian hyperstimulation syndrome.  A condition called polycystic ovary syndrome can cause hormonal imbalances that can lead to  nonfunctional ovarian cysts. SIGNS AND SYMPTOMS  Many ovarian cysts do not cause symptoms. If symptoms are present, they may include:  Pelvic pain or pressure.  Pain in the lower abdomen.  Pain during sexual intercourse.  Increasing girth (swelling) of the abdomen.  Abnormal menstrual periods.  Increasing pain with menstrual periods.  Stopping having menstrual periods without being pregnant. DIAGNOSIS  These cysts are commonly found during a routine or annual pelvic exam. Tests may be ordered to find out more about the cyst. These tests may include:  Ultrasound.  X-ray of the pelvis.  CT scan.  MRI.  Blood tests. TREATMENT  Many ovarian cysts go away on their own without treatment. Your health care provider may want to check your cyst regularly for 2-3 months to see if it changes. For women in menopause, it is particularly important to monitor a cyst closely because of the higher rate of ovarian cancer in menopausal women. When treatment is needed, it may include any of the following:  A procedure to drain the cyst (aspiration). This may be done using a long needle and ultrasound. It can also be done through a laparoscopic procedure. This involves using a thin, lighted tube with a tiny camera on the end (laparoscope) inserted through a small incision.  Surgery to remove the whole cyst. This may be done using laparoscopic surgery or an open surgery involving a larger incision in the lower abdomen.  Hormone treatment or birth control pills. These methods are sometimes used to help dissolve a cyst. HOME CARE INSTRUCTIONS   Only take over-the-counter   or prescription medicines as directed by your health care provider.  Follow up with your health care provider as directed.  Get regular pelvic exams and Pap tests. SEEK MEDICAL CARE IF:   Your periods are late, irregular, or painful, or they stop.  Your pelvic pain or abdominal pain does not go away.  Your abdomen becomes  larger or swollen.  You have pressure on your bladder or trouble emptying your bladder completely.  You have pain during sexual intercourse.  You have feelings of fullness, pressure, or discomfort in your stomach.  You lose weight for no apparent reason.  You feel generally ill.  You become constipated.  You lose your appetite.  You develop acne.  You have an increase in body and facial hair.  You are gaining weight, without changing your exercise and eating habits.  You think you are pregnant. SEEK IMMEDIATE MEDICAL CARE IF:   You have increasing abdominal pain.  You feel sick to your stomach (nauseous), and you throw up (vomit).  You develop a fever that comes on suddenly.  You have abdominal pain during a bowel movement.  Your menstrual periods become heavier than usual. MAKE SURE YOU:  Understand these instructions.  Will watch your condition.  Will get help right away if you are not doing well or get worse. Document Released: 12/21/2004 Document Revised: 12/26/2012 Document Reviewed: 08/28/2012 Exit[employer] Patient Information 2015 [employer]. This information is not intended to replace advice given to you by your health care provider. Make sure you discuss any questions you have with your health care provider.  

## 2014-08-15 NOTE — ED Notes (Signed)
Pt reports being seen at [facility] on Monday for same LLQ pain. Reports being diagnosed with vaginal infection and ovarian cyst. No improvement in pain, denies urinary symptoms.

## 2014-08-15 NOTE — ED Notes (Signed)
Pt provided urine sample in triage.

## 2014-08-15 NOTE — ED Provider Notes (Signed)
CSN: [id number]     Arrival date & time 08/15/14  7829 History   First MD Initiated Contact with Patient 08/15/14 1058     Chief Complaint  Patient presents with  . Abdominal Pain     (Consider location/radiation/quality/duration/timing/severity/associated sxs/prior Treatment) Patient is a 44 y.o. female presenting with abdominal pain. The history is provided by the patient.  Abdominal Pain Associated symptoms: vaginal bleeding   Associated symptoms: no chest pain, no chills, no diarrhea, no fever, no nausea, no shortness of breath and no vomiting    patient presents with lower abdominal pain. Began a few days ago was seen at [facility] [place] and had a CT scan that showed a hemorrhagic cyst. Pain is continued. States she also had slight vaginal bleeding. States she was started on antibiotics the pain is continued. She has a follow-up appointment with gynecology, but it is scheduled for another 2 weeks.  Past Medical History  Diagnosis Date  . Anemia     resolved  . Migraine    Past Surgical History  Procedure Laterality Date  . Cesarean section    . Tubal ligation    . Uterine ablation     Family History  Problem Relation Age of Onset  . Hypertension Mother   . Diabetes Mother   . Hypertension Father   . Heart attack Neg Hx   . Hyperlipidemia Neg Hx   . Sudden death Neg Hx    Social History  Substance Use Topics  . Smoking status: Former Games developer  . Smokeless tobacco: None  . Alcohol Use: No     Comment: occasional   OB History    No data available     Review of Systems  Constitutional: Negative for fever, chills, activity change and appetite change.  Eyes: Negative for pain.  Respiratory: Negative for chest tightness and shortness of breath.   Cardiovascular: Negative for chest pain and leg swelling.  Gastrointestinal: Positive for abdominal pain. Negative for nausea, vomiting and diarrhea.  Genitourinary: Positive for vaginal bleeding. Negative for flank  pain.  Musculoskeletal: Negative for back pain and neck stiffness.  Skin: Negative for rash.  Neurological: Negative for weakness, numbness and headaches.  Psychiatric/Behavioral: Negative for behavioral problems.      Allergies  Hydrocodone and Morphine and related  Home Medications   Prior to Admission medications   Medication Sig Start Date End Date Taking? Authorizing Provider  doxycycline (VIBRAMYCIN) 100 MG capsule Take 1 capsule (100 mg total) by mouth 2 (two) times daily. 08/12/14  Yes Hannah Muthersbaugh, PA-C  ferrous sulfate 325 (65 FE) MG tablet Take 325 mg by mouth daily with breakfast.     Yes Historical Provider, MD  ibuprofen (ADVIL,MOTRIN) 800 MG tablet Take 1 tablet (800 mg total) by mouth 3 (three) times daily. 06/01/14  Yes Ofilia Neas, PA-C  diclofenac (VOLTAREN) 75 MG EC tablet Take 1 tablet (75 mg total) by mouth 2 (two) times daily. Patient not taking: Reported on 08/15/2014 06/06/14   Ozella Rocks, MD  traMADol (ULTRAM) 50 MG tablet Take 1 tablet (50 mg total) by mouth every 6 (six) hours as needed. 08/15/14   Benjiman Core, MD  traMADol-acetaminophen (ULTRACET) 37.5-325 MG per tablet Take 1 tablet by mouth every 6 (six) hours as needed. Patient not taking: Reported on 08/15/2014 06/07/14   Ofilia Neas, PA-C   BP 109/64 mmHg  Pulse 87  Temp(Src) 98.2 F (36.8 C) (Oral)  Resp 16  SpO2 100% Physical Exam  Constitutional: She appears well-developed.  HENT:  Head: Normocephalic.  Eyes: Pupils are equal, round, and reactive to light.  Neck: Neck supple.  Cardiovascular: Normal rate.   Abdominal: Soft. There is tenderness.  Left lower quadrant tenderness with some mild fullness. No rebound or guarding. No hernias palpated  Musculoskeletal: Normal range of motion.  Neurological: She is alert.  Skin: Skin is warm.   pelvic deferred due to recent pelvic exam.  ED Course  Procedures (including critical care time) Labs Review Labs Reviewed   URINALYSIS, ROUTINE W REFLEX MICROSCOPIC (NOT AT [facility])    Imaging Review No results found.   EKG Interpretation None      MDM   Final diagnoses:  LLQ pain  Cyst of left ovary    Patient with left lower quadrant abdominal pain. Recent CT scan that showed ovarian cyst. CT scan done by myself. Does have some slight fluid in uterus post ablation. Has gynecology follow-up    Benjiman Core, MD 08/19/14 [phone]

## 2014-09-10 ENCOUNTER — Ambulatory Visit (INDEPENDENT_AMBULATORY_CARE_PROVIDER_SITE_OTHER): Payer: BLUE CROSS/BLUE SHIELD | Admitting: Physician Assistant

## 2014-09-10 VITALS — BP 108/74 | HR 82 | Temp 98.5°F | Resp 16 | Ht 68.25 in | Wt 181.4 lb

## 2014-09-10 DIAGNOSIS — R102 Pelvic and perineal pain: Secondary | ICD-10-CM | POA: Diagnosis not present

## 2014-09-10 MED ORDER — TRAMADOL HCL 50 MG PO TABS: 50.0000 mg | ORAL_TABLET | Freq: Four times a day (QID) | ORAL | Status: DC | PRN

## 2014-09-10 NOTE — Progress Notes (Signed)
[facility] [address], [place] [address] [phone]  Date:  09/10/2014   Name:  [name]   DOB:  09/07/1970   MRN:  [id number]  PCP:  No PCP Per Patient    History of Present Illness:  [name] is a 44 y.o. female patient who presents to [facility] for chief complaint of pelvic pain for 1 month that has progressively worsened over the last week.  Patient was seen at the ED for pelvic pain 08/15/2014.  It was found on [place] and CT, bilateral ovarian cysts.  Referred to her OB/GYN for 09/17/2014.  Patient has relieved the pain with the tramadol that was given in the ED, but she is running out today.  She reports that over the weekend her pelvic pain became worse.  It is localized at the lower left side, but there is discomfort along the pelvic.  She has no nausea, vomiting, fatigue, abnormal vaginal bleeding, or fever.  Rates the pain 7-8/10.  She was seen at an ED in [facility] due to the pain, and advised to rtn to her pcp once she arrived back to her home town. -Appt. With Dr. Gaynell Face 09/17/2014.  Hx of ablation.  No restart of menses or vaginal bleeding.  No diarrhea or constipation.         Patient Active Problem List   Diagnosis Date Noted  . Left knee pain 09/23/2011  . Right knee pain 08/02/2011  . Low back pain 08/02/2011    Past Medical History  Diagnosis Date  . Anemia     resolved  . Migraine     Past Surgical History  Procedure Laterality Date  . Cesarean section    . Tubal ligation    . Uterine ablation      Social History  Substance Use Topics  . Smoking status: Former Games developer  . Smokeless tobacco: None  . Alcohol Use: No     Comment: occasional    Family History  Problem Relation Age of Onset  . Hypertension Mother   . Diabetes Mother   . Hypertension Father   . Heart attack Neg Hx   . Hyperlipidemia Neg Hx   . Sudden death Neg Hx     Allergies  Allergen Reactions  . Hydrocodone Nausea And Vomiting  . Morphine And Related      Medication list has been reviewed and updated.  Current Outpatient Prescriptions on File Prior to Visit  Medication Sig Dispense Refill  . ferrous sulfate 325 (65 FE) MG tablet Take 325 mg by mouth daily with breakfast.      . ibuprofen (ADVIL,MOTRIN) 800 MG tablet Take 1 tablet (800 mg total) by mouth 3 (three) times daily. 21 tablet 0  . diclofenac (VOLTAREN) 75 MG EC tablet Take 1 tablet (75 mg total) by mouth 2 (two) times daily. (Patient not taking: Reported on 08/15/2014) 60 tablet 0  . doxycycline (VIBRAMYCIN) 100 MG capsule Take 1 capsule (100 mg total) by mouth 2 (two) times daily. (Patient not taking: Reported on 09/10/2014) 28 capsule 0  . traMADol-acetaminophen (ULTRACET) 37.5-325 MG per tablet Take 1 tablet by mouth every 6 (six) hours as needed. (Patient not taking: Reported on 08/15/2014) 30 tablet 0  . [DISCONTINUED] albuterol (PROVENTIL HFA;VENTOLIN HFA) 108 (90 BASE) MCG/ACT inhaler Inhale 2 puffs into the lungs every 4 (four) hours as needed for wheezing or shortness of breath. 1 Inhaler 0   No current facility-administered medications on file prior to visit.  ROS ROS otherwise unremarkable unless listed above.   Physical Examination: BP 108/74 mmHg  Pulse 82  Temp(Src) 98.5 F (36.9 C) (Oral)  Resp 16  Ht 5' 8.25" (1.734 m)  Wt 181 lb 6.4 oz (82.283 kg)  BMI 27.37 kg/m2  SpO2 98% Ideal Body Weight: Weight in (lb) to have BMI = 25: 165.3  Physical Exam  Constitutional: She is oriented to person, place, and time. She appears well-developed and well-nourished. No distress.  Cardiovascular: Normal rate and regular rhythm.   Pulmonary/Chest: Effort normal. No respiratory distress. She has no wheezes.  Genitourinary: Pelvic exam was performed with patient supine. There is no rash or tenderness on the right labia. There is no rash or tenderness on the left labia. Cervix exhibits no motion tenderness.  Possible mass appreciated at left adnexa, very tender.  Some  tenderness over right adnexa as well.  No CMT.  No bleeding appreciated on bimanual exam, at least could be found within the vaginal canal.   Neurological: She is alert and oriented to person, place, and time.  Psychiatric: She has a normal mood and affect. Her behavior is normal.     Assessment and Plan: 44 year old female is here today with pelvic pain.  Contacted Dr. Elsie Stain office of [place].  She is given an earlier appt. 2 days out on Thursday, 09/12/2014 at 2pm.  VS wnl.  I am going to refill with 10 tablets while we await consult with Dr. Gaynell Face.   -Advised patient of alarming sxs (dizziness, vaginal bleeding, sob, nausea, vomiting, increased constant pain, etc.), that would warrant rtn or to the ED.  Patient understands.    Pelvic pain in female - Plan: traMADol (ULTRAM) 50 MG tablet  Trena Platt, PA-C [facility] 9/6/20166:06 PM

## 2014-09-10 NOTE — Patient Instructions (Signed)
Please be at your appointment at 2pm.

## 2014-09-12 ENCOUNTER — Other Ambulatory Visit (HOSPITAL_COMMUNITY): Payer: Self-pay | Admitting: Obstetrics

## 2014-09-12 DIAGNOSIS — Z1231 Encounter for screening mammogram for malignant neoplasm of breast: Secondary | ICD-10-CM

## 2014-09-12 LAB — PROCEDURE REPORT - SCANNED: PAP SMEAR: NEGATIVE

## 2014-09-20 ENCOUNTER — Ambulatory Visit (HOSPITAL_COMMUNITY): Payer: BLUE CROSS/BLUE SHIELD

## 2014-09-23 ENCOUNTER — Ambulatory Visit (HOSPITAL_COMMUNITY): Payer: BLUE CROSS/BLUE SHIELD | Attending: Obstetrics

## 2014-10-12 ENCOUNTER — Encounter (HOSPITAL_BASED_OUTPATIENT_CLINIC_OR_DEPARTMENT_OTHER): Payer: Self-pay | Admitting: *Deleted

## 2014-10-12 ENCOUNTER — Emergency Department (HOSPITAL_BASED_OUTPATIENT_CLINIC_OR_DEPARTMENT_OTHER)
Admission: EM | Admit: 2014-10-12 | Discharge: 2014-10-12 | Disposition: A | Discharge status: Home / Self Care | Payer: BLUE CROSS/BLUE SHIELD | Attending: Emergency Medicine | Admitting: Emergency Medicine

## 2014-10-12 DIAGNOSIS — Z79899 Other long term (current) drug therapy: Secondary | ICD-10-CM | POA: Diagnosis not present

## 2014-10-12 DIAGNOSIS — R102 Pelvic and perineal pain: Secondary | ICD-10-CM | POA: Insufficient documentation

## 2014-10-12 DIAGNOSIS — Z8679 Personal history of other diseases of the circulatory system: Secondary | ICD-10-CM | POA: Insufficient documentation

## 2014-10-12 DIAGNOSIS — R1030 Lower abdominal pain, unspecified: Secondary | ICD-10-CM | POA: Diagnosis present

## 2014-10-12 DIAGNOSIS — Z791 Long term (current) use of non-steroidal anti-inflammatories (NSAID): Secondary | ICD-10-CM | POA: Insufficient documentation

## 2014-10-12 DIAGNOSIS — N898 Other specified noninflammatory disorders of vagina: Secondary | ICD-10-CM | POA: Diagnosis not present

## 2014-10-12 DIAGNOSIS — R1031 Right lower quadrant pain: Secondary | ICD-10-CM | POA: Diagnosis not present

## 2014-10-12 DIAGNOSIS — Z87891 Personal history of nicotine dependence: Secondary | ICD-10-CM | POA: Insufficient documentation

## 2014-10-12 DIAGNOSIS — R1032 Left lower quadrant pain: Secondary | ICD-10-CM | POA: Diagnosis not present

## 2014-10-12 DIAGNOSIS — Z3202 Encounter for pregnancy test, result negative: Secondary | ICD-10-CM | POA: Insufficient documentation

## 2014-10-12 LAB — URINALYSIS, ROUTINE W REFLEX MICROSCOPIC
Bilirubin Urine: NEGATIVE
Glucose, UA: NEGATIVE mg/dL
Hgb urine dipstick: NEGATIVE
KETONES UR: NEGATIVE mg/dL
LEUKOCYTES UA: NEGATIVE
NITRITE: NEGATIVE
PH: 7.5 (ref 5.0–8.0)
PROTEIN: NEGATIVE mg/dL
Specific Gravity, Urine: 1.023 (ref 1.005–1.030)
Urobilinogen, UA: 1 mg/dL (ref 0.0–1.0)

## 2014-10-12 LAB — PREGNANCY, URINE: Preg Test, Ur: NEGATIVE

## 2014-10-12 LAB — WET PREP, GENITAL
TRICH WET PREP: NONE SEEN
YEAST WET PREP: NONE SEEN

## 2014-10-12 MED ORDER — TRAMADOL HCL 50 MG PO TABS: 50.0000 mg | ORAL_TABLET | Freq: Four times a day (QID) | ORAL | Status: DC | PRN

## 2014-10-12 NOTE — ED Notes (Signed)
Presents abd pain, states has cyst on ovaries, was seen by OB/GYN, given pain med, has had uterine ablation

## 2014-10-12 NOTE — ED Notes (Signed)
Denies any N/V/D 

## 2014-10-12 NOTE — ED Provider Notes (Signed)
CSN: [id number]     Arrival date & time 10/12/14  4098 History   First MD Initiated Contact with Patient 10/12/14 1002     Chief Complaint  Patient presents with  . Abdominal Pain     (Consider location/radiation/quality/duration/timing/severity/associated sxs/prior Treatment) Patient is a 44 y.o. female presenting with abdominal pain.  Abdominal Pain Pain location:  Suprapubic, LLQ and RLQ Pain quality: sharp   Pain radiates to:  Does not radiate Pain severity:  Moderate Onset quality:  Gradual Duration:  3 days Timing:  Constant Progression:  Unchanged Chronicity:  Recurrent Context comment:  Ho known ovarian cysts with similar symptoms Relieved by:  Nothing Worsened by:  Movement and palpation Ineffective treatments:  None tried Associated symptoms: no anorexia, no diarrhea, no fever, no nausea and no vomiting     Past Medical History  Diagnosis Date  . Anemia     resolved  . Migraine    Past Surgical History  Procedure Laterality Date  . Cesarean section    . Tubal ligation    . Uterine ablation     Family History  Problem Relation Age of Onset  . Hypertension Mother   . Diabetes Mother   . Hypertension Father   . Heart attack Neg Hx   . Hyperlipidemia Neg Hx   . Sudden death Neg Hx    Social History  Substance Use Topics  . Smoking status: Former Games developer  . Smokeless tobacco: None  . Alcohol Use: No     Comment: occasional   OB History    No data available     Review of Systems  Constitutional: Negative for fever.  Gastrointestinal: Positive for abdominal pain. Negative for nausea, vomiting, diarrhea and anorexia.  All other systems reviewed and are negative.     Allergies  Hydrocodone and Morphine and related  Home Medications   Prior to Admission medications   Medication Sig Start Date End Date Taking? Authorizing Provider  ferrous sulfate 325 (65 FE) MG tablet Take 325 mg by mouth daily with breakfast.     Yes Historical Provider, MD   ibuprofen (ADVIL,MOTRIN) 800 MG tablet Take 1 tablet (800 mg total) by mouth 3 (three) times daily. 06/01/14  Yes Ofilia Neas, PA-C  diclofenac (VOLTAREN) 75 MG EC tablet Take 1 tablet (75 mg total) by mouth 2 (two) times daily. Patient not taking: Reported on 08/15/2014 06/06/14   Ozella Rocks, MD  doxycycline (VIBRAMYCIN) 100 MG capsule Take 1 capsule (100 mg total) by mouth 2 (two) times daily. Patient not taking: Reported on 09/10/2014 08/12/14   Dahlia Client Muthersbaugh, PA-C  traMADol (ULTRAM) 50 MG tablet Take 1 tablet (50 mg total) by mouth every 6 (six) hours as needed. 10/12/14   Mirian Mo, MD  traMADol-acetaminophen (ULTRACET) 37.5-325 MG per tablet Take 1 tablet by mouth every 6 (six) hours as needed. Patient not taking: Reported on 08/15/2014 06/07/14   Ofilia Neas, PA-C   BP 109/53 mmHg  Pulse 66  Temp(Src) 98.7 F (37.1 C) (Oral)  Resp 16  Ht  (1.778 m)  Wt 180 lb (81.647 kg)  BMI 25.83 kg/m2  SpO2 100% Physical Exam  Constitutional: She is oriented to person, place, and time. She appears well-developed and well-nourished.  HENT:  Head: Normocephalic and atraumatic.  Right Ear: External ear normal.  Left Ear: External ear normal.  Eyes: Conjunctivae and EOM are normal. Pupils are equal, round, and reactive to light.  Neck: Normal range of motion. Neck supple.  Cardiovascular: Normal rate, regular rhythm, normal heart sounds and intact distal pulses.   Pulmonary/Chest: Effort normal and breath sounds normal.  Abdominal: Soft. Bowel sounds are normal. There is tenderness in the right lower quadrant and left lower quadrant.  Genitourinary: Cervix exhibits discharge (white/yellow thick). Cervix exhibits no motion tenderness and no friability. Right adnexum displays tenderness. Left adnexum displays tenderness.  Musculoskeletal: Normal range of motion.  Neurological: She is alert and oriented to person, place, and time.  Skin: Skin is warm and dry.  Vitals  reviewed.   ED Course  Procedures (including critical care time) Labs Review Labs Reviewed  WET PREP, GENITAL - Abnormal; Notable for the following:    Clue Cells Wet Prep HPF POC FEW (*)    WBC, Wet Prep HPF POC FEW (*)    All other components within normal limits  URINALYSIS, ROUTINE W REFLEX MICROSCOPIC (NOT AT [facility])  PREGNANCY, URINE  GC/CHLAMYDIA PROBE AMP ([place]) NOT AT [facility]    Imaging Review No results found. I have personally reviewed and evaluated these images and lab results as part of my medical decision-making.   EKG Interpretation None      MDM   Final diagnoses:  Lower abdominal pain    44 y.o. female with pertinent PMH of prior pelvic pain from cysts presents with recurrent abd pain.  Identical to prior.  No systemic symptoms.  Exam as above.  Offered empiric abx, pt elected to wait on results.  Consider this reasonable as exam does not demonstrate cervicitis.  Considered treatment for BV, however pt is not bothered by discharge, it is malodorous, and I instructed her to stop douching.  DC home in stable condition with tramadol.    I have reviewed all laboratory and imaging studies if ordered as above  1. Lower abdominal pain   2. Pelvic pain in female         Mirian Mo, MD 10/12/14 [phone]

## 2014-10-12 NOTE — Discharge Instructions (Signed)

## 2014-10-14 LAB — GC/CHLAMYDIA PROBE AMP (~~LOC~~) NOT AT ARMC
Chlamydia: NEGATIVE
NEISSERIA GONORRHEA: NEGATIVE

## 2014-11-08 ENCOUNTER — Encounter (HOSPITAL_BASED_OUTPATIENT_CLINIC_OR_DEPARTMENT_OTHER): Payer: Self-pay | Admitting: *Deleted

## 2014-11-08 ENCOUNTER — Emergency Department (HOSPITAL_BASED_OUTPATIENT_CLINIC_OR_DEPARTMENT_OTHER)
Admission: EM | Admit: 2014-11-08 | Discharge: 2014-11-08 | Disposition: A | Discharge status: Home / Self Care | Payer: BLUE CROSS/BLUE SHIELD | Attending: Emergency Medicine | Admitting: Emergency Medicine

## 2014-11-08 DIAGNOSIS — D649 Anemia, unspecified: Secondary | ICD-10-CM | POA: Diagnosis not present

## 2014-11-08 DIAGNOSIS — Z87442 Personal history of urinary calculi: Secondary | ICD-10-CM | POA: Insufficient documentation

## 2014-11-08 DIAGNOSIS — N83202 Unspecified ovarian cyst, left side: Secondary | ICD-10-CM | POA: Diagnosis not present

## 2014-11-08 DIAGNOSIS — R10814 Left lower quadrant abdominal tenderness: Secondary | ICD-10-CM

## 2014-11-08 DIAGNOSIS — Z87891 Personal history of nicotine dependence: Secondary | ICD-10-CM | POA: Insufficient documentation

## 2014-11-08 DIAGNOSIS — Z8679 Personal history of other diseases of the circulatory system: Secondary | ICD-10-CM | POA: Insufficient documentation

## 2014-11-08 DIAGNOSIS — Z3202 Encounter for pregnancy test, result negative: Secondary | ICD-10-CM | POA: Insufficient documentation

## 2014-11-08 DIAGNOSIS — R1032 Left lower quadrant pain: Secondary | ICD-10-CM | POA: Diagnosis present

## 2014-11-08 DIAGNOSIS — Z9851 Tubal ligation status: Secondary | ICD-10-CM | POA: Insufficient documentation

## 2014-11-08 HISTORY — DX: Calculus of kidney: N20.0

## 2014-11-08 HISTORY — DX: Unspecified ovarian cyst, right side: N83.202

## 2014-11-08 HISTORY — DX: Unspecified ovarian cyst, right side: N83.201

## 2014-11-08 LAB — URINALYSIS, ROUTINE W REFLEX MICROSCOPIC
GLUCOSE, UA: NEGATIVE mg/dL
HGB URINE DIPSTICK: NEGATIVE
Ketones, ur: NEGATIVE mg/dL
Leukocytes, UA: NEGATIVE
Nitrite: NEGATIVE
PH: 8.5 — AB (ref 5.0–8.0)
Protein, ur: 100 mg/dL — AB
SPECIFIC GRAVITY, URINE: 1.029 (ref 1.005–1.030)
Urobilinogen, UA: 1 mg/dL (ref 0.0–1.0)

## 2014-11-08 LAB — URINE MICROSCOPIC-ADD ON

## 2014-11-08 LAB — PREGNANCY, URINE: PREG TEST UR: NEGATIVE

## 2014-11-08 MED ORDER — TRAMADOL HCL 50 MG PO TABS: 50.0000 mg | ORAL_TABLET | Freq: Four times a day (QID) | ORAL | Status: DC | PRN

## 2014-11-08 NOTE — ED Provider Notes (Signed)
CSN: [id number]     Arrival date & time 11/08/14  0940 History   First MD Initiated Contact with Patient 11/08/14 1210     Chief Complaint  Patient presents with  . Abdominal Pain     (Consider location/radiation/quality/duration/timing/severity/associated sxs/prior Treatment) HPI Comments: 4-755mos off and on, would last for 1 week, then improve No fevers/discharge/bleeding   Patient is a 44 y.o. female presenting with abdominal pain.  Abdominal Pain Pain location:  LLQ Pain quality: aching   Pain quality comment:  "pain that's there" Pain radiates to:  Does not radiate Onset quality:  Gradual Duration:  3 days Relieved by:  NSAIDs Worsened by:  Movement (with walking around) Ineffective treatments:  None tried Associated symptoms: no anorexia, no chest pain, no cough, no diarrhea, no dysuria, no fever, no hematuria, no nausea, no shortness of breath, no sore throat, no vaginal bleeding, no vaginal discharge and no vomiting     Past Medical History  Diagnosis Date  . Anemia     resolved  . Migraine   . Bilateral ovarian cysts   . Kidney stone    Past Surgical History  Procedure Laterality Date  . Cesarean section    . Tubal ligation    . Uterine ablation     Family History  Problem Relation Age of Onset  . Hypertension Mother   . Diabetes Mother   . Hypertension Father   . Heart attack Neg Hx   . Hyperlipidemia Neg Hx   . Sudden death Neg Hx    Social History  Substance Use Topics  . Smoking status: Former Games developermoker  . Smokeless tobacco: None  . Alcohol Use: No     Comment: occasional   OB History    No data available     Review of Systems  Constitutional: Negative for fever.  HENT: Negative for sore throat.   Eyes: Negative for visual disturbance.  Respiratory: Negative for cough and shortness of breath.   Cardiovascular: Negative for chest pain.  Gastrointestinal: Positive for abdominal pain. Negative for nausea, vomiting, diarrhea and anorexia.   Genitourinary: Negative for dysuria, hematuria, vaginal bleeding, vaginal discharge and difficulty urinating.  Musculoskeletal: Negative for back pain and neck pain.  Skin: Negative for rash.  Neurological: Negative for syncope and headaches.      Allergies  Hydrocodone and Morphine and related  Home Medications   Prior to Admission medications   Medication Sig Start Date End Date Taking? Authorizing Provider  ferrous sulfate 325 (65 FE) MG tablet Take 325 mg by mouth daily with breakfast.     Yes Historical Provider, MD  diclofenac (VOLTAREN) 75 MG EC tablet Take 1 tablet (75 mg total) by mouth 2 (two) times daily. Patient not taking: Reported on 08/15/2014 06/06/14   Ozella Rocksavid J Merrell, MD  doxycycline (VIBRAMYCIN) 100 MG capsule Take 1 capsule (100 mg total) by mouth 2 (two) times daily. Patient not taking: Reported on 09/10/2014 08/12/14   Dahlia ClientHannah Muthersbaugh, PA-C  ibuprofen (ADVIL,MOTRIN) 800 MG tablet Take 1 tablet (800 mg total) by mouth 3 (three) times daily. 06/01/14   Ofilia NeasMichael L Clark, PA-C  traMADol (ULTRAM) 50 MG tablet Take 1 tablet (50 mg total) by mouth every 6 (six) hours as needed. 10/12/14   Mirian MoMatthew Gentry, MD  traMADol-acetaminophen (ULTRACET) 37.5-325 MG per tablet Take 1 tablet by mouth every 6 (six) hours as needed. Patient not taking: Reported on 08/15/2014 06/07/14   Ofilia NeasMichael L Clark, PA-C   BP 102/66 mmHg  Pulse 89  Temp(Src) 98.5 F (36.9 C) (Oral)  Resp 18  Ht  (1.778 m)  Wt 180 lb (81.647 kg)  BMI 25.83 kg/m2  SpO2 100% Physical Exam  Constitutional: She is oriented to person, place, and time. She appears well-developed and well-nourished. No distress.  HENT:  Head: Normocephalic and atraumatic.  Eyes: Conjunctivae and EOM are normal.  Neck: Normal range of motion.  Cardiovascular: Normal rate, regular rhythm, normal heart sounds and intact distal pulses.  Exam reveals no gallop and no friction rub.   No murmur heard. Pulmonary/Chest: Effort normal and  breath sounds normal. No respiratory distress. She has no wheezes. She has no rales.  Abdominal: Soft. She exhibits no distension. There is tenderness (LLQ). There is no rebound and no guarding.  Musculoskeletal: She exhibits no edema or tenderness.  Neurological: She is alert and oriented to person, place, and time.  Skin: Skin is warm and dry. No rash noted. She is not diaphoretic. No erythema.  Nursing note and vitals reviewed.   ED Course  Procedures (including critical care time) Labs Review Labs Reviewed  URINALYSIS, ROUTINE W REFLEX MICROSCOPIC (NOT AT [facility]) - Abnormal; Notable for the following:    pH 8.5 (*)    Bilirubin Urine SMALL (*)    Protein, ur 100 (*)    All other components within normal limits  URINE MICROSCOPIC-ADD ON - Abnormal; Notable for the following:    Squamous Epithelial / LPF FEW (*)    Bacteria, UA MANY (*)    All other components within normal limits    Imaging Review No results found. I have personally reviewed and evaluated these images and lab results as part of my medical decision-making.   EKG Interpretation None     MDM   Final diagnoses:  None   44yo female with history of ovarian cysts presents with concern for LLQ abdominal pain.  Pt reports this pain feels just like pain she has had previously and was diagnosed as ovarian cysts. Seen 10/8 for same, had testing for GC/Chl which were negative.  Offered pelvic exam and testing, however pt does not have discharge, pain is same as prior episodes and she declines testing which is reasonable given recent evaluation for same without sign of infxn and doubt PID/TOA.  History not consistent with torsion.  No diarrhea/doubt diverticulitis.  Urinalysis appears contaminated, no wbc, doubt UTI. UPreg negative.  Pain likely exacerbation of pain related to cysts.  Given short rx for tramadol as she reports this has worked in past and pt following up with OBGYN as in 1-2weeks. Patient discharged in stable  condition with understanding of reasons to return.    Alvira Monday, MD 11/09/14 1213

## 2014-11-08 NOTE — ED Notes (Signed)
Patient states she has a three day history of left lower abdominal pain.  Recent diagnosis of ovarian cysts.

## 2015-05-14 ENCOUNTER — Ambulatory Visit (HOSPITAL_COMMUNITY)
Admission: EM | Admit: 2015-05-14 | Discharge: 2015-05-14 | Disposition: A | Discharge status: Home / Self Care | Payer: 59 | Attending: Family Medicine | Admitting: Family Medicine

## 2015-05-14 ENCOUNTER — Encounter (HOSPITAL_COMMUNITY): Payer: Self-pay | Admitting: *Deleted

## 2015-05-14 DIAGNOSIS — N83202 Unspecified ovarian cyst, left side: Secondary | ICD-10-CM | POA: Diagnosis not present

## 2015-05-14 MED ORDER — TRAMADOL HCL 50 MG PO TABS: 50.0000 mg | ORAL_TABLET | Freq: Four times a day (QID) | ORAL | Status: DC | PRN

## 2015-05-14 NOTE — ED Notes (Signed)
Pt  Reports  A  History  Of  Ovarian  Cysts     Pt  Reports  Pain l  Lower   Quad       X  4  Days    Scheduled  For  Surgery  Next   Month     pt  denys  Any  Other symptoms  Pain   Relived  By otc  meds

## 2015-05-14 NOTE — ED Provider Notes (Signed)
CSN: [id number]     Arrival date & time 05/14/15  1303 History   First MD Initiated Contact with Patient 05/14/15 1327     Chief Complaint  Patient presents with  . Ovarian Cyst   (Consider location/radiation/quality/duration/timing/severity/associated sxs/prior Treatment) Patient is a 45 y.o. female presenting with female genitourinary complaint. The history is provided by the patient.  Female GU Problem This is a recurrent problem. The current episode started more than 2 days ago. The problem has not changed since onset.Associated symptoms include abdominal pain. Associated symptoms comments: Onset after sex 4d ago, no fever, no bleeding.    Past Medical History  Diagnosis Date  . Anemia     resolved  . Migraine   . Bilateral ovarian cysts   . Kidney stone    Past Surgical History  Procedure Laterality Date  . Cesarean section    . Tubal ligation    . Uterine ablation     Family History  Problem Relation Age of Onset  . Hypertension Mother   . Diabetes Mother   . Hypertension Father   . Heart attack Neg Hx   . Hyperlipidemia Neg Hx   . Sudden death Neg Hx    Social History  Substance Use Topics  . Smoking status: Former Games developer  . Smokeless tobacco: None  . Alcohol Use: No     Comment: occasional   OB History    No data available     Review of Systems  Gastrointestinal: Positive for abdominal pain.  Genitourinary: Positive for pelvic pain. Negative for dysuria, flank pain and menstrual problem.  All other systems reviewed and are negative.   Allergies  Hydrocodone and Morphine and related  Home Medications   Prior to Admission medications   Medication Sig Start Date End Date Taking? Authorizing Provider  diclofenac (VOLTAREN) 75 MG EC tablet Take 1 tablet (75 mg total) by mouth 2 (two) times daily. Patient not taking: Reported on 08/15/2014 06/06/14   Ozella Rocks, MD  doxycycline (VIBRAMYCIN) 100 MG capsule Take 1 capsule (100 mg total) by mouth 2 (two)  times daily. Patient not taking: Reported on 09/10/2014 08/12/14   Dahlia Client Muthersbaugh, PA-C  ferrous sulfate 325 (65 FE) MG tablet Take 325 mg by mouth daily with breakfast.      Historical Provider, MD  ibuprofen (ADVIL,MOTRIN) 800 MG tablet Take 1 tablet (800 mg total) by mouth 3 (three) times daily. 06/01/14   Ofilia Neas, PA-C  traMADol (ULTRAM) 50 MG tablet Take 1 tablet (50 mg total) by mouth every 6 (six) hours as needed for moderate pain. 05/14/15   Linna Hoff, MD  traMADol-acetaminophen (ULTRACET) 37.5-325 MG per tablet Take 1 tablet by mouth every 6 (six) hours as needed. Patient not taking: Reported on 08/15/2014 06/07/14   Ofilia Neas, PA-C   Meds Ordered and Administered this Visit  Medications - No data to display  BP 115/61 mmHg  Pulse 73  Temp(Src) 99.5 F (37.5 C) (Oral)  Resp 16  SpO2 100% No data found.   Physical Exam  Constitutional: She is oriented to person, place, and time. She appears well-developed and well-nourished.  Abdominal: Soft. Normal appearance and bowel sounds are normal. She exhibits no mass. There is tenderness. There is no rigidity, no rebound, no guarding and no CVA tenderness.    Neurological: She is alert and oriented to person, place, and time.  Skin: Skin is warm and dry.  Nursing note and vitals reviewed.   ED  Course  Procedures (including critical care time)  Labs Review Labs Reviewed - No data to display  Imaging Review No results found.   Visual Acuity Review  Right Eye Distance:   Left Eye Distance:   Bilateral Distance:    Right Eye Near:   Left Eye Near:    Bilateral Near:         MDM   1. Cyst of left ovary    Meds ordered this encounter  Medications  . traMADol (ULTRAM) 50 MG tablet    Sig: Take 1 tablet (50 mg total) by mouth every 6 (six) hours as needed for moderate pain.    Dispense:  15 tablet    Refill:  1       Linna HoffJames D Kindl, MD 05/14/15 [id number]

## 2015-05-14 NOTE — Discharge Instructions (Signed)
See your doctor if further problems. °

## 2015-07-12 ENCOUNTER — Ambulatory Visit (HOSPITAL_COMMUNITY)
Admission: EM | Admit: 2015-07-12 | Discharge: 2015-07-12 | Disposition: A | Discharge status: Home / Self Care | Payer: 59 | Attending: Radiology | Admitting: Radiology

## 2015-07-12 ENCOUNTER — Encounter (HOSPITAL_COMMUNITY): Payer: Self-pay | Admitting: Family Medicine

## 2015-07-12 DIAGNOSIS — N83202 Unspecified ovarian cyst, left side: Secondary | ICD-10-CM

## 2015-07-12 MED ORDER — TRAMADOL HCL 50 MG PO TABS: 50.0000 mg | ORAL_TABLET | Freq: Four times a day (QID) | ORAL | Status: DC | PRN

## 2015-07-12 NOTE — ED Provider Notes (Signed)
CSN: [id number]     Arrival date & time 07/12/15  1255 History   None    Chief Complaint  Patient presents with  . Abdominal Pain   (Consider location/radiation/quality/duration/timing/severity/associated sxs/prior Treatment) HPI Comments: Patient presents with LLQ pain X 2 days. Patient states that the pain occurred after an episode of sex 2 days prior. Patient states that the pain is similar to pain that she experiences after sexual intercourse. Patient states that she is having a hysterectomy at the end of the month. Patient denies any fevers, nausea, vomiting or pain with urination. Condition is made worse by sexual intercourse. Condition is made better by tramadol. Patient states that she is currently out of her pain medication. Patient denies relief with Naproxen.   Patient is a 45 y.o. female presenting with abdominal pain. The history is provided by the patient.  Abdominal Pain Associated symptoms: no fever and no vaginal discharge (normal clear discharge per patient)     Past Medical History  Diagnosis Date  . Anemia     resolved  . Migraine   . Bilateral ovarian cysts   . Kidney stone    Past Surgical History  Procedure Laterality Date  . Cesarean section    . Tubal ligation    . Uterine ablation     Family History  Problem Relation Age of Onset  . Hypertension Mother   . Diabetes Mother   . Hypertension Father   . Heart attack Neg Hx   . Hyperlipidemia Neg Hx   . Sudden death Neg Hx    Social History  Substance Use Topics  . Smoking status: Former Games developermoker  . Smokeless tobacco: None  . Alcohol Use: No     Comment: occasional   OB History    No data available     Review of Systems  Constitutional: Negative.  Negative for fever.  Gastrointestinal: Positive for abdominal pain.  Genitourinary: Positive for dyspareunia. Negative for vaginal discharge (normal clear discharge per patient), difficulty urinating, menstrual problem and pelvic pain.    Allergies   Hydrocodone and Morphine and related  Home Medications   Prior to Admission medications   Medication Sig Start Date End Date Taking? Authorizing Provider  diclofenac (VOLTAREN) 75 MG EC tablet Take 1 tablet (75 mg total) by mouth 2 (two) times daily. Patient not taking: Reported on 08/15/2014 06/06/14   Ozella Rocksavid J Merrell, MD  doxycycline (VIBRAMYCIN) 100 MG capsule Take 1 capsule (100 mg total) by mouth 2 (two) times daily. Patient not taking: Reported on 09/10/2014 08/12/14   Dahlia ClientHannah Muthersbaugh, PA-C  ferrous sulfate 325 (65 FE) MG tablet Take 325 mg by mouth daily with breakfast.      Historical Provider, MD  ibuprofen (ADVIL,MOTRIN) 800 MG tablet Take 1 tablet (800 mg total) by mouth 3 (three) times daily. 06/01/14   Ofilia NeasMichael L Clark, PA-C  traMADol (ULTRAM) 50 MG tablet Take 1 tablet (50 mg total) by mouth every 6 (six) hours as needed for moderate pain. 05/14/15   Linna HoffJames D Kindl, MD  traMADol-acetaminophen (ULTRACET) 37.5-325 MG per tablet Take 1 tablet by mouth every 6 (six) hours as needed. Patient not taking: Reported on 08/15/2014 06/07/14   Ofilia NeasMichael L Clark, PA-C   Meds Ordered and Administered this Visit  Medications - No data to display  BP 133/72 mmHg  Pulse 91  Temp(Src) 98 F (36.7 C)  Resp 18  SpO2 100% No data found.   Physical Exam  Constitutional: She is oriented to person,  place, and time. She appears well-nourished.  Cardiovascular: Normal rate, regular rhythm and normal heart sounds.   Abdominal: There is tenderness.  Neurological: She is alert and oriented to person, place, and time. She has normal reflexes.  Skin: Skin is warm and dry.    ED Course  Procedures (including critical care time)  Labs Review Labs Reviewed - No data to display  Imaging Review No results found.   Visual Acuity Review  Right Eye Distance:   Left Eye Distance:   Bilateral Distance:    Right Eye Near:   Left Eye Near:    Bilateral Near:         MDM  No diagnosis  found. Follow up with obgyn as scheduled    Alene Mires, NP 07/12/15 1356  Alene Mires, NP 07/12/15 1432

## 2015-07-12 NOTE — ED Notes (Addendum)
Pt here for lower abd pain . sts that she has ovarian cysts and is suppose to have a hysterectomy on the 25th. sts she is unable to see her doctor until next Friday. Denies N,V,D. Denies bleeding. Pt sts all started after having sex

## 2015-08-30 ENCOUNTER — Other Ambulatory Visit: Payer: Self-pay | Admitting: Physician Assistant

## 2015-08-30 DIAGNOSIS — Z1231 Encounter for screening mammogram for malignant neoplasm of breast: Secondary | ICD-10-CM

## 2015-10-01 ENCOUNTER — Encounter: Payer: Self-pay | Admitting: *Deleted

## 2015-10-15 ENCOUNTER — Emergency Department (HOSPITAL_COMMUNITY)
Admission: EM | Admit: 2015-10-15 | Discharge: 2015-10-15 | Disposition: A | Discharge status: Home / Self Care | Payer: 59 | Attending: Emergency Medicine | Admitting: Emergency Medicine

## 2015-10-15 ENCOUNTER — Encounter (HOSPITAL_COMMUNITY): Payer: Self-pay

## 2015-10-15 DIAGNOSIS — Z79899 Other long term (current) drug therapy: Secondary | ICD-10-CM | POA: Diagnosis not present

## 2015-10-15 DIAGNOSIS — K029 Dental caries, unspecified: Secondary | ICD-10-CM | POA: Insufficient documentation

## 2015-10-15 DIAGNOSIS — Z87891 Personal history of nicotine dependence: Secondary | ICD-10-CM | POA: Diagnosis not present

## 2015-10-15 DIAGNOSIS — K0889 Other specified disorders of teeth and supporting structures: Secondary | ICD-10-CM | POA: Diagnosis present

## 2015-10-15 MED ORDER — CLINDAMYCIN HCL 300 MG PO CAPS: 300.0000 mg | ORAL_CAPSULE | Freq: Four times a day (QID) | ORAL | 0 refills | Status: DC

## 2015-10-15 MED ORDER — OXYCODONE-ACETAMINOPHEN 5-325 MG PO TABS
2.0000 | ORAL_TABLET | Freq: Once | ORAL | Status: AC
  Administered 2015-10-15: 2 via ORAL
  Filled 2015-10-15: qty 2

## 2015-10-15 NOTE — ED Provider Notes (Signed)
[facility]-EMERGENCY DEPT Provider Note   CSN: [id number] Arrival date & time: 10/15/15  4098     History   Chief Complaint Chief Complaint  Patient presents with  . Dental Pain    HPI [name] is a 45 y.o. female.  This a 45 year old female patient who complains of dental pain and facial swelling. She states over the last months she's had some intermittent swelling to her left face with pain around her left back tooth. She was seen by an oral surgeon and placed on a one-week course of amoxicillin. She finished this about 3-4 weeks ago. She states the swelling got a little better after the antibiotics but over the last 2-3 days she's had worsening pain around the tooth that radiates to her left sinus area. She's had a little bit of swelling to her face as well. No difficulty handling secretions. No known fevers. No nausea or vomiting.    Dental Pain      Past Medical History:  Diagnosis Date  . Anemia    resolved  . Bilateral ovarian cysts   . Kidney stone   . Migraine     Patient Active Problem List   Diagnosis Date Noted  . Left knee pain 09/23/2011  . Right knee pain 08/02/2011  . Low back pain 08/02/2011    Past Surgical History:  Procedure Laterality Date  . CESAREAN SECTION    . TUBAL LIGATION    . uterine ablation      OB History    No data available       Home Medications    Prior to Admission medications   Medication Sig Start Date End Date Taking? Authorizing Provider  ferrous sulfate 325 (65 FE) MG tablet Take 325 mg by mouth 2 (two) times daily with a meal.    Yes Historical Provider, MD  clindamycin (CLEOCIN) 300 MG capsule Take 1 capsule (300 mg total) by mouth 4 (four) times daily. X 7 days 10/15/15   Rolan Bucco, MD  traMADol (ULTRAM) 50 MG tablet Take 1 tablet (50 mg total) by mouth every 6 (six) hours as needed for moderate pain. Patient not taking: Reported on 10/15/2015 05/14/15   Linna Hoff, MD  traMADol (ULTRAM) 50 MG tablet  Take 1 tablet (50 mg total) by mouth every 6 (six) hours as needed for moderate pain. Patient not taking: Reported on 10/15/2015 07/12/15   Alene Mires, NP    Family History Family History  Problem Relation Age of Onset  . Hypertension Mother   . Diabetes Mother   . Hypertension Father   . Heart attack Neg Hx   . Hyperlipidemia Neg Hx   . Sudden death Neg Hx     Social History Social History  Substance Use Topics  . Smoking status: Former Games developer  . Smokeless tobacco: Not on file  . Alcohol use No     Comment: occasional     Allergies   Morphine and related and Hydrocodone   Review of Systems Review of Systems  Constitutional: Negative for fever.  HENT: Positive for dental problem and facial swelling. Negative for congestion.   Respiratory: Negative for shortness of breath.   Gastrointestinal: Negative for nausea and vomiting.  Skin: Negative for wound.  Neurological: Positive for headaches.     Physical Exam Updated Vital Signs BP 129/61 (BP Location: Left Arm)   Pulse 72   Temp 98.7 F (37.1 C) (Oral)   Resp 16   SpO2 100%  Physical Exam  Constitutional: She appears well-developed and well-nourished.  HENT:  Head: Normocephalic and atraumatic.  Patient has marked dental caries with tenderness to the left upper back molars. No fluctuance or induration is noted. There is a small amount of surrounding swelling to his left face. No trismus. Uvula is midline. No elevation of the tongue.     ED Treatments / Results  Labs (all labs ordered are listed, but only abnormal results are displayed) Labs Reviewed - No data to display  EKG  EKG Interpretation None       Radiology No results found.  Procedures Procedures (including critical care time)  Medications Ordered in ED Medications  oxyCODONE-acetaminophen (PERCOCET/ROXICET) 5-325 MG per tablet 2 tablet (not administered)     Initial Impression / Assessment and Plan / ED Course  I have  reviewed the triage vital signs and the nursing notes.  Pertinent labs & imaging results that were available during my care of the patient were reviewed by me and considered in my medical decision making (see chart for details).  Clinical Course    Patient presents with left-sided dental tenderness and mild facial swelling. I will go ahead and start her on clindamycin. I encouraged her to have close follow-up with her oral surgeon. She was given strict return precautions to return if she has any worsening swelling or difficulty swallowing.  Final Clinical Impressions(s) / ED Diagnoses   Final diagnoses:  Dental caries    New Prescriptions New Prescriptions   CLINDAMYCIN (CLEOCIN) 300 MG CAPSULE    Take 1 capsule (300 mg total) by mouth 4 (four) times daily. X 7 days     Rolan BuccoMelanie Jasey Cortez, MD 10/15/15 1135

## 2015-10-15 NOTE — ED Triage Notes (Signed)
She states she has a left-sided upper toothache with some facial (sinus area) discomfort x 2 days.

## 2016-01-24 ENCOUNTER — Emergency Department (INDEPENDENT_AMBULATORY_CARE_PROVIDER_SITE_OTHER)
Admission: EM | Admit: 2016-01-24 | Discharge: 2016-01-24 | Disposition: A | Discharge status: Home / Self Care | Payer: 59 | Source: Home / Self Care | Attending: Family Medicine | Admitting: Family Medicine

## 2016-01-24 ENCOUNTER — Encounter: Payer: Self-pay | Admitting: Emergency Medicine

## 2016-01-24 DIAGNOSIS — R1032 Left lower quadrant pain: Secondary | ICD-10-CM

## 2016-01-24 MED ORDER — TRAMADOL HCL 50 MG PO TABS: ORAL_TABLET | ORAL | 0 refills | Status: DC

## 2016-01-24 MED ORDER — NAPROXEN 500 MG PO TABS: 500.0000 mg | ORAL_TABLET | Freq: Two times a day (BID) | ORAL | 1 refills | Status: DC

## 2016-01-24 NOTE — ED Provider Notes (Signed)
[facility]    CSN: [id number] Arrival date & time: 01/24/16  1028     History   Chief Complaint Chief Complaint  Patient presents with  . Pelvic Pain    HPI [name] is a 46 y.o. female.   Patient has a history of recurring pelvic pain resulting from ovarian cysts.  Three days ago she developed recurrent typical left lower abdominal pain after sexual intercourse.  Her pain always improves with Naproxen 500mg  and she is requesting refill.  No nausea/vomiting.  No fevers, chills, and sweats.  No urinary symptoms.  No vaginal discharge.  No LMP recorded. Patient has had an ablation.   She notes tramadol is also helpful for acute flare-ups.   The history is provided by the patient.  Pelvic Pain  This is a recurrent problem. Episode onset: 3 days ago. The problem occurs constantly. The problem has been gradually improving. Pertinent negatives include no chest pain, no headaches and no shortness of breath. The symptoms are aggravated by intercourse and walking. The symptoms are relieved by NSAIDs. She has tried nothing for the symptoms.    Past Medical History:  Diagnosis Date  . Anemia    resolved  . Bilateral ovarian cysts   . Kidney stone   . Migraine     Patient Active Problem List   Diagnosis Date Noted  . Left knee pain 09/23/2011  . Right knee pain 08/02/2011  . Low back pain 08/02/2011    Past Surgical History:  Procedure Laterality Date  . CESAREAN SECTION    . TUBAL LIGATION    . uterine ablation      OB History    No data available       Home Medications    Prior to Admission medications   Medication Sig Start Date End Date Taking? Authorizing Provider  ferrous sulfate 325 (65 FE) MG tablet Take 325 mg by mouth 2 (two) times daily with a meal.     Historical Provider, MD  naproxen (NAPROSYN) 500 MG tablet Take 1 tablet (500 mg total) by mouth 2 (two) times daily. (every 12 hours with food) 01/24/16   Lattie HawStephen A Elihue Ebert, MD  traMADol  (ULTRAM) 50 MG tablet Take one tab by mouth every 6 hours as needed for pain. 01/24/16   Lattie HawStephen A Jamiyah Dingley, MD    Family History Family History  Problem Relation Age of Onset  . Hypertension Mother   . Diabetes Mother   . Hypertension Father   . Heart attack Neg Hx   . Hyperlipidemia Neg Hx   . Sudden death Neg Hx     Social History Social History  Substance Use Topics  . Smoking status: Former Games developermoker  . Smokeless tobacco: Never Used  . Alcohol use No     Comment: occasional     Allergies   Morphine and related and Hydrocodone   Review of Systems Review of Systems  Constitutional: Negative for activity change, appetite change, chills, diaphoresis, fatigue and fever.  Respiratory: Negative for shortness of breath.   Cardiovascular: Negative for chest pain.  Genitourinary: Positive for pelvic pain.  Neurological: Negative for headaches.  All other systems reviewed and are negative.    Physical Exam Triage Vital Signs ED Triage Vitals  Enc Vitals Group     BP 01/24/16 1055 126/75     Pulse Rate 01/24/16 1055 76     Resp --      Temp 01/24/16 1055 98.4 F (36.9 C)  Temp Source 01/24/16 1055 Oral     SpO2 01/24/16 1055 100 %     Weight 01/24/16 1055 188 lb (85.3 kg)     Height 01/24/16 1055 5\' 10"  (1.778 m)     Head Circumference --      Peak Flow --      Pain Score 01/24/16 1056 7     Pain Loc --      Pain Edu? --      Excl. in GC? --    No data found.   Updated Vital Signs BP 126/75 (BP Location: Left Arm)   Pulse 76   Temp 98.4 F (36.9 C) (Oral)   Ht 5\' 10"  (1.778 m)   Wt 188 lb (85.3 kg)   SpO2 100%   BMI 26.98 kg/m   Visual Acuity Right Eye Distance:   Left Eye Distance:   Bilateral Distance:    Right Eye Near:   Left Eye Near:    Bilateral Near:     Physical Exam  Constitutional: She appears well-nourished. No distress.  HENT:  Head: Normocephalic.  Right Ear: External ear normal.  Left Ear: External ear normal.  Nose: Nose  normal.  Mouth/Throat: Oropharynx is clear and moist.  Eyes: Conjunctivae are normal. Pupils are equal, round, and reactive to light.  Neck: Neck supple.  Cardiovascular: Normal heart sounds.   Pulmonary/Chest: Breath sounds normal.  Abdominal: Soft. Bowel sounds are normal. She exhibits no mass. There is no hepatosplenomegaly. There is tenderness in the left lower quadrant. There is no rigidity, no rebound and no guarding.    There is mild tenderness to palpation left lower quadrant as noted on diagram.   Musculoskeletal: She exhibits no edema.  Neurological: She is alert.  Skin: Skin is warm and dry. No rash noted.  Nursing note and vitals reviewed.    UC Treatments / Results  Labs (all labs ordered are listed, but only abnormal results are displayed) Labs Reviewed - No data to display  EKG  EKG Interpretation None       Radiology No results found.  Procedures Procedures (including critical care time)  Medications Ordered in UC Medications - No data to display   Initial Impression / Assessment and Plan / UC Course  I have reviewed the triage vital signs and the nursing notes.  Pertinent labs & imaging results that were available during my care of the patient were reviewed by me and considered in my medical decision making (see chart for details).    Resume Naproxen 500mg  BID and Tramadol 50mg  Q6hr prn If symptoms become significantly worse during the night or over the weekend, proceed to the local emergency room.  Followup with Family Doctor if not improved in about 6 days.    Final Clinical Impressions(s) / UC Diagnoses   Final diagnoses:  Abdominal pain, acute, left lower quadrant    New Prescriptions New Prescriptions   NAPROXEN (NAPROSYN) 500 MG TABLET    Take 1 tablet (500 mg total) by mouth 2 (two) times daily. (every 12 hours with food)   TRAMADOL (ULTRAM) 50 MG TABLET    Take one tab by mouth every 6 hours as needed for pain.     Lattie Haw, MD 01/31/16 2001

## 2016-01-24 NOTE — ED Triage Notes (Signed)
Pt c/o left sided pelvic pain x 3 days, dx w/bilateral ovarian cyst, larger cyst on left side.

## 2016-01-24 NOTE — Discharge Instructions (Signed)
If symptoms become significantly worse during the night or over the weekend, proceed to the local emergency room.  

## 2016-02-23 ENCOUNTER — Encounter (HOSPITAL_COMMUNITY): Payer: Self-pay | Admitting: Emergency Medicine

## 2016-02-23 ENCOUNTER — Ambulatory Visit (HOSPITAL_COMMUNITY)
Admission: EM | Admit: 2016-02-23 | Discharge: 2016-02-23 | Disposition: A | Discharge status: Home / Self Care | Payer: 59 | Attending: Internal Medicine | Admitting: Internal Medicine

## 2016-02-23 DIAGNOSIS — N83202 Unspecified ovarian cyst, left side: Secondary | ICD-10-CM | POA: Diagnosis not present

## 2016-02-23 MED ORDER — NAPROXEN 500 MG PO TABS: 500.0000 mg | ORAL_TABLET | Freq: Two times a day (BID) | ORAL | 0 refills | Status: DC

## 2016-02-23 NOTE — ED Triage Notes (Signed)
[facility] with a complaint upper left abdominal pain that she stated is an ovarian cyst. The patient stated that her OB/GYN has retired and she has a future appointment with a new one.

## 2016-02-23 NOTE — ED Provider Notes (Signed)
CSN: [id number]     Arrival date & time 02/23/16  1002 History   First MD Initiated Contact with Patient 02/23/16 1114     Chief Complaint  Patient presents with  . Abdominal Pain   (Consider location/radiation/quality/duration/timing/severity/associated sxs/prior Treatment) Patient c/o left pelvic/ abdominal discomfort which she describes as chronic and has an OBGYN appointment in near future.  She states she usually take naprosyn and tramadol for this.  She denies any vaginal discharge.   The history is provided by the patient.  Abdominal Pain  Pain location:  Generalized Pain quality: aching   Pain radiates to:  Does not radiate Pain severity:  Moderate Onset quality:  Gradual Duration:  3 weeks Timing:  Intermittent Progression:  Waxing and waning Chronicity:  New Relieved by:  Nothing Worsened by:  Bowel movements   Past Medical History:  Diagnosis Date  . Anemia    resolved  . Bilateral ovarian cysts   . Kidney stone   . Migraine    Past Surgical History:  Procedure Laterality Date  . CESAREAN SECTION    . TUBAL LIGATION    . uterine ablation     Family History  Problem Relation Age of Onset  . Hypertension Mother   . Diabetes Mother   . Hypertension Father   . Heart attack Neg Hx   . Hyperlipidemia Neg Hx   . Sudden death Neg Hx    Social History  Substance Use Topics  . Smoking status: Former Games developer  . Smokeless tobacco: Never Used  . Alcohol use No     Comment: occasional   OB History    No data available     Review of Systems  Constitutional: Negative.   HENT: Negative.   Eyes: Negative.   Respiratory: Negative.   Cardiovascular: Negative.   Gastrointestinal: Positive for abdominal pain.  Endocrine: Negative.   Genitourinary: Negative.   Musculoskeletal: Negative.   Allergic/Immunologic: Negative.   Neurological: Negative.   Hematological: Negative.   Psychiatric/Behavioral: Negative.     Allergies  Morphine and related and  Hydrocodone  Home Medications   Prior to Admission medications   Medication Sig Start Date End Date Taking? Authorizing Provider  ferrous sulfate 325 (65 FE) MG tablet Take 325 mg by mouth 2 (two) times daily with a meal.    Yes Historical Provider, MD  naproxen (NAPROSYN) 500 MG tablet Take 1 tablet (500 mg total) by mouth 2 (two) times daily with a meal. 02/23/16   Deatra Canter, FNP   Meds Ordered and Administered this Visit  Medications - No data to display  BP 110/69 (BP Location: Right Arm)   Pulse 80   Temp 98.6 F (37 C) (Oral)   Resp 18   SpO2 100%  No data found.   Physical Exam  Constitutional: She appears well-developed and well-nourished.  HENT:  Head: Normocephalic and atraumatic.  Eyes: Conjunctivae and EOM are normal. Pupils are equal, round, and reactive to light.  Neck: Normal range of motion.  Cardiovascular: Normal rate, regular rhythm and normal heart sounds.   Pulmonary/Chest: Effort normal and breath sounds normal.  Abdominal: Soft. There is tenderness.  Tender LLQ.  Nursing note and vitals reviewed.   Urgent Care Course     Procedures (including critical care time)  Labs Review Labs Reviewed - No data to display  Imaging Review No results found.   Visual Acuity Review  Right Eye Distance:   Left Eye Distance:   Bilateral Distance:  Right Eye Near:   Left Eye Near:    Bilateral Near:         MDM   1. Left ovarian cyst    Naprosyn 500mg  one po bid prn #20  Follow up with OBGYN    Deatra CanterWilliam J Oxford, FNP 02/23/16 1157

## 2016-04-24 ENCOUNTER — Encounter: Payer: Self-pay | Admitting: Emergency Medicine

## 2016-04-24 ENCOUNTER — Emergency Department (INDEPENDENT_AMBULATORY_CARE_PROVIDER_SITE_OTHER): Payer: 59

## 2016-04-24 ENCOUNTER — Emergency Department (INDEPENDENT_AMBULATORY_CARE_PROVIDER_SITE_OTHER)
Admission: EM | Admit: 2016-04-24 | Discharge: 2016-04-24 | Disposition: A | Discharge status: Home / Self Care | Payer: 59 | Source: Home / Self Care | Attending: Family Medicine | Admitting: Family Medicine

## 2016-04-24 DIAGNOSIS — R51 Headache: Secondary | ICD-10-CM | POA: Diagnosis not present

## 2016-04-24 DIAGNOSIS — R519 Headache, unspecified: Secondary | ICD-10-CM

## 2016-04-24 DIAGNOSIS — K047 Periapical abscess without sinus: Secondary | ICD-10-CM

## 2016-04-24 MED ORDER — TRAMADOL HCL 50 MG PO TABS: 50.0000 mg | ORAL_TABLET | Freq: Four times a day (QID) | ORAL | 0 refills | Status: DC | PRN

## 2016-04-24 MED ORDER — CLINDAMYCIN HCL 300 MG PO CAPS: 300.0000 mg | ORAL_CAPSULE | Freq: Three times a day (TID) | ORAL | 0 refills | Status: DC

## 2016-04-24 NOTE — ED Provider Notes (Signed)
[name] CARE    CSN: [id number] Arrival date & time: 04/24/16  1030     History   Chief Complaint Chief Complaint  Patient presents with  . Sinusitis    HPI [name] is a 46 y.o. female.   Patient repots that she was started on Augmentin 5 days ago because of an abscess in a left upper tooth.  She has an upcoming appointment with an oral surgeon for further treatment.  During the past 3 days she has had increased pain in her left cheek.  She has mild nasal congestion.   The history is provided by the patient.    Past Medical History:  Diagnosis Date  . Anemia    resolved  . Bilateral ovarian cysts   . Kidney stone   . Migraine     Patient Active Problem List   Diagnosis Date Noted  . Left knee pain 09/23/2011  . Right knee pain 08/02/2011  . Low back pain 08/02/2011    Past Surgical History:  Procedure Laterality Date  . CESAREAN SECTION    . TUBAL LIGATION    . uterine ablation      OB History    No data available       Home Medications    Prior to Admission medications   Medication Sig Start Date End Date Taking? Authorizing Provider  clindamycin (CLEOCIN) 300 MG capsule Take 1 capsule (300 mg total) by mouth 3 (three) times daily. (Take every 8 hours) 04/24/16   Lattie Haw, MD  ferrous sulfate 325 (65 FE) MG tablet Take 325 mg by mouth 2 (two) times daily with a meal.     Historical Provider, MD  traMADol (ULTRAM) 50 MG tablet Take 1 tablet (50 mg total) by mouth every 6 (six) hours as needed. 04/24/16   Lattie Haw, MD    Family History Family History  Problem Relation Age of Onset  . Hypertension Mother   . Diabetes Mother   . Hypertension Father   . Heart attack Neg Hx   . Hyperlipidemia Neg Hx   . Sudden death Neg Hx     Social History Social History  Substance Use Topics  . Smoking status: Former Games developer  . Smokeless tobacco: Never Used  . Alcohol use No     Comment: occasional     Allergies   Morphine  and related and Hydrocodone   Review of Systems Review of Systems  Constitutional: Negative for appetite change, chills, diaphoresis, fatigue and fever.  HENT: Positive for congestion, dental problem and sinus pain. Negative for ear pain, facial swelling, postnasal drip, sore throat and trouble swallowing.   Eyes: Negative.   Respiratory: Negative.   Cardiovascular: Negative.   Gastrointestinal: Negative.   Musculoskeletal: Negative.   Neurological: Positive for headaches. Negative for dizziness.     Physical Exam Triage Vital Signs ED Triage Vitals  Enc Vitals Group     BP 04/24/16 1050 130/71     Pulse Rate 04/24/16 1050 84     Resp --      Temp 04/24/16 1050 98.4 F (36.9 C)     Temp Source 04/24/16 1050 Oral     SpO2 04/24/16 1050 100 %     Weight 04/24/16 1051 180 lb (81.6 kg)     Height 04/24/16 1051  (1.778 m)     Head Circumference --      Peak Flow --      Pain Score 04/24/16 1051 7  Pain Loc --      Pain Edu? --      Excl. in GC? --    No data found.   Updated Vital Signs BP 130/71 (BP Location: Left Arm)   Pulse 84   Temp 98.4 F (36.9 C) (Oral)   Ht  (1.778 m)   Wt 180 lb (81.6 kg)   SpO2 100%   BMI 25.83 kg/m   Visual Acuity Right Eye Distance:   Left Eye Distance:   Bilateral Distance:    Right Eye Near:   Left Eye Near:    Bilateral Near:     Physical Exam  Constitutional: She appears well-developed and well-nourished. No distress.  HENT:  Head: Normocephalic.    Right Ear: Tympanic membrane, external ear and ear canal normal.  Left Ear: Tympanic membrane, external ear and ear canal normal.  Nose: Nose normal.  There is tenderness to palpation over left face as noted on diagram.  No swelling or warmth.  Multiple eroded upper teeth with erythematous gingiva.  No fluctuance.  Eyes: Conjunctivae and EOM are normal. Pupils are equal, round, and reactive to light.  Neck: Neck supple.  Cardiovascular: Normal heart  sounds.   Pulmonary/Chest: Breath sounds normal.  Abdominal: There is no tenderness.  Lymphadenopathy:    She has no cervical adenopathy.  Neurological: She is alert.  Skin: Skin is warm and dry. No rash noted.  Nursing note and vitals reviewed.    UC Treatments / Results  Labs (all labs ordered are listed, but only abnormal results are displayed) Labs Reviewed - No data to display  EKG  EKG Interpretation None       Radiology Dg Sinuses Complete  Result Date: 04/24/2016 CLINICAL DATA:  Pt states that for the past 3 days she has had a headache with left sided facial pain and pressure. EXAM: PARANASAL SINUSES - COMPLETE 3 + VIEW COMPARISON:  None. FINDINGS: The paranasal sinus are aerated. There is no evidence of sinus opacification air-fluid levels or mucosal thickening. No significant bone abnormalities are seen. IMPRESSION: Negative. Electronically Signed   By: Amie Portland M.D.   On: 04/24/2016 11:39   Review of CT facial bones 12/03/13 reveals multiple periapical abscesses.  Procedures Procedures (including critical care time)  Medications Ordered in UC Medications - No data to display   Initial Impression / Assessment and Plan / UC Course  I have reviewed the triage vital signs and the nursing notes.  Pertinent labs & imaging results that were available during my care of the patient were reviewed by me and considered in my medical decision making (see chart for details).    Begin Clindamycin  Q8hr.  Tramadol for pain. May continue Aleve, 2 tabs every 12 hours with food. If symptoms become significantly worse during the night or over the weekend, proceed to the local emergency room.  Followup with oral surgeon as scheduled.    Final Clinical Impressions(s) / UC Diagnoses   Final diagnoses:  Left facial pressure and pain  Periapical abscess    New Prescriptions New Prescriptions   CLINDAMYCIN (CLEOCIN) 300 MG CAPSULE    Take 1 capsule (300 mg total)  by mouth 3 (three) times daily. (Take every 8 hours)   TRAMADOL (ULTRAM) 50 MG TABLET    Take 1 tablet (50 mg total) by mouth every 6 (six) hours as needed.     Lattie Haw, MD 05/05/16 [phone]

## 2016-04-24 NOTE — Discharge Instructions (Signed)
May continue Aleve, 2 tabs every 12 hours with food. If symptoms become significantly worse during the night or over the weekend, proceed to the local emergency room.

## 2016-04-24 NOTE — ED Triage Notes (Signed)
Pt c/o migraine headache, left side facial pressure x 2 days, some runny nose.

## 2016-04-28 ENCOUNTER — Encounter: Payer: Self-pay | Admitting: *Deleted

## 2016-04-28 ENCOUNTER — Emergency Department (INDEPENDENT_AMBULATORY_CARE_PROVIDER_SITE_OTHER)
Admission: EM | Admit: 2016-04-28 | Discharge: 2016-04-28 | Disposition: A | Discharge status: Home / Self Care | Payer: 59 | Source: Home / Self Care

## 2016-04-28 DIAGNOSIS — R197 Diarrhea, unspecified: Secondary | ICD-10-CM

## 2016-04-28 DIAGNOSIS — R112 Nausea with vomiting, unspecified: Secondary | ICD-10-CM

## 2016-04-28 DIAGNOSIS — K047 Periapical abscess without sinus: Secondary | ICD-10-CM

## 2016-04-28 MED ORDER — ONDANSETRON HCL 4 MG PO TABS: 4.0000 mg | ORAL_TABLET | Freq: Four times a day (QID) | ORAL | 0 refills | Status: DC

## 2016-04-28 MED ORDER — TRAMADOL HCL 50 MG PO TABS: 50.0000 mg | ORAL_TABLET | Freq: Four times a day (QID) | ORAL | 0 refills | Status: DC | PRN

## 2016-04-28 MED ORDER — AMOXICILLIN 500 MG PO CAPS: 500.0000 mg | ORAL_CAPSULE | Freq: Three times a day (TID) | ORAL | 0 refills | Status: DC

## 2016-04-28 NOTE — ED Provider Notes (Signed)
CSN: [id number]     Arrival date & time 04/28/16  1059 History   First MD Initiated Contact with Patient 04/28/16 1115     Chief Complaint  Patient presents with  . Allergic Reaction  . Diarrhea  . Nausea   (Consider location/radiation/quality/duration/timing/severity/associated sxs/prior Treatment) HPI  [name] is a 46 y.o. female presenting to UC with c/o n/v/d after taking clindamycin, prescribed to her at [facility] on 04/24/16 for suspected dental abscess. Pt notes she felt sick after the first dose but thought it was due to not eating so she tried eating more and notes she had 2 days worth of the medication but still had stomach upset so she stopped. Denies improvement of facial pain or swelling so she knows she needs an antibiotic but cannot continue taking current Clindamycin.  She has taken tramadol as needed/as prescribed for pain but does not feel that is causing her stomach upset.  Denies fever, chills, or difficulty breathing or swallowing. She does have a f/u appointment with Oral Surgery on May 2nd.     Past Medical History:  Diagnosis Date  . Anemia    resolved  . Bilateral ovarian cysts   . Kidney stone   . Migraine    Past Surgical History:  Procedure Laterality Date  . CESAREAN SECTION    . TUBAL LIGATION    . uterine ablation     Family History  Problem Relation Age of Onset  . Hypertension Mother   . Diabetes Mother   . Hypertension Father   . Heart attack Neg Hx   . Hyperlipidemia Neg Hx   . Sudden death Neg Hx    Social History  Substance Use Topics  . Smoking status: Former Games developer  . Smokeless tobacco: Never Used  . Alcohol use No     Comment: occasional   OB History    No data available     Review of Systems  Constitutional: Negative for chills and fever.  HENT: Positive for dental problem and facial swelling (Left side). Negative for congestion, ear pain, sore throat, trouble swallowing and voice change.   Respiratory: Negative for cough and  shortness of breath.   Cardiovascular: Negative for chest pain and palpitations.  Gastrointestinal: Positive for diarrhea, nausea and vomiting. Negative for abdominal pain.  Musculoskeletal: Negative for arthralgias, back pain and myalgias.  Skin: Negative for rash.  Neurological: Positive for headaches ( Left side). Negative for dizziness and light-headedness.    Allergies  Morphine and related; Clindamycin/lincomycin; and Hydrocodone  Home Medications   Prior to Admission medications   Medication Sig Start Date End Date Taking? Authorizing Provider  amoxicillin (AMOXIL) 500 MG capsule Take 1 capsule (500 mg total) by mouth 3 (three) times daily. 04/28/16   Junius Finner, PA-C  ferrous sulfate 325 (65 FE) MG tablet Take 325 mg by mouth 2 (two) times daily with a meal.     Historical Provider, MD  ondansetron (ZOFRAN) 4 MG tablet Take 1 tablet (4 mg total) by mouth every 6 (six) hours. 04/28/16   Junius Finner, PA-C  traMADol (ULTRAM) 50 MG tablet Take 1 tablet (50 mg total) by mouth every 6 (six) hours as needed. 04/24/16   Lattie Haw, MD  traMADol (ULTRAM) 50 MG tablet Take 1 tablet (50 mg total) by mouth every 6 (six) hours as needed. 04/28/16   Junius Finner, PA-C   Meds Ordered and Administered this Visit  Medications - No data to display  BP (!) 146/81 (BP  Location: Left Arm)   Pulse 79   Temp 98.6 F (37 C) (Oral)   Resp 18   Ht  (1.778 m)   Wt 185 lb (83.9 kg)   SpO2 100%   BMI 26.54 kg/m  No data found.   Physical Exam  Constitutional: She is oriented to person, place, and time. She appears well-developed and well-nourished.  HENT:  Head: Normocephalic and atraumatic.    Right Ear: Tympanic membrane normal.  Left Ear: Tympanic membrane normal.  Nose: Right sinus exhibits no maxillary sinus tenderness and no frontal sinus tenderness. Left sinus exhibits maxillary sinus tenderness. Left sinus exhibits no frontal sinus tenderness.  Mouth/Throat: Uvula is  midline, oropharynx is clear and moist and mucous membranes are normal. Abnormal dentition. Dental abscesses and dental caries present.  Eyes: EOM are normal.  Neck: Normal range of motion.  Cardiovascular: Normal rate.   Pulmonary/Chest: Effort normal.  Musculoskeletal: Normal range of motion.  Neurological: She is alert and oriented to person, place, and time.  Skin: Skin is warm and dry.  Psychiatric: She has a normal mood and affect. Her behavior is normal.  Nursing note and vitals reviewed.   Urgent Care Course     Procedures (including critical care time)  Labs Review Labs Reviewed - No data to display  Imaging Review No results found.   MDM   1. Dental abscess   2. Nausea vomiting and diarrhea    Pt c/o n/v/d believed to be due to clindamycin prescribed for likely dental abscess on 04/24/16.  Exam today c/w dental abscess. Rx: Amoxicillin, Zofran and refill of tramadol  Encouraged warm saltwater gargles.  f/u with oral surgeon as previously scheduled.     Junius Finner, PA-C 04/28/16 1137    Junius Finner, PA-C 04/28/16 1138

## 2016-04-28 NOTE — ED Triage Notes (Signed)
Pt c/o nausea, diarrhea and vomiting after taking Clindamycin.

## 2016-04-28 NOTE — Discharge Instructions (Signed)
°  Tramadol is strong pain medication. While taking, do not drink alcohol, drive, or perform any other activities that requires focus while taking these medications.  ° °

## 2016-05-18 ENCOUNTER — Emergency Department (INDEPENDENT_AMBULATORY_CARE_PROVIDER_SITE_OTHER): Payer: 59

## 2016-05-18 ENCOUNTER — Encounter: Payer: Self-pay | Admitting: *Deleted

## 2016-05-18 ENCOUNTER — Emergency Department (INDEPENDENT_AMBULATORY_CARE_PROVIDER_SITE_OTHER)
Admission: EM | Admit: 2016-05-18 | Discharge: 2016-05-18 | Disposition: A | Discharge status: Home / Self Care | Payer: 59 | Source: Home / Self Care | Attending: Family Medicine | Admitting: Family Medicine

## 2016-05-18 ENCOUNTER — Emergency Department: Payer: 59

## 2016-05-18 DIAGNOSIS — M7989 Other specified soft tissue disorders: Secondary | ICD-10-CM | POA: Diagnosis not present

## 2016-05-18 DIAGNOSIS — M79672 Pain in left foot: Secondary | ICD-10-CM

## 2016-05-18 DIAGNOSIS — M79662 Pain in left lower leg: Secondary | ICD-10-CM | POA: Diagnosis not present

## 2016-05-18 DIAGNOSIS — M25572 Pain in left ankle and joints of left foot: Secondary | ICD-10-CM

## 2016-05-18 MED ORDER — NAPROXEN 500 MG PO TABS: 500.0000 mg | ORAL_TABLET | Freq: Two times a day (BID) | ORAL | 0 refills | Status: DC

## 2016-05-18 NOTE — ED Triage Notes (Signed)
Pt c/o LLE swelling and pain x 1 wk. Denies injury. Reports feeling warm to touch at times. Also, notes a small bruised area near her ankle.

## 2016-05-18 NOTE — ED Notes (Signed)
Spoke to pt given US results. Clemens Catholichristy Nohemi Nicklaus, LPN

## 2016-05-18 NOTE — ED Provider Notes (Signed)
CSN: [id number]     Arrival date & time 05/18/16  1029 History   First MD Initiated Contact with Patient 05/18/16 1043     Chief Complaint  Patient presents with  . Leg Pain   (Consider location/radiation/quality/duration/timing/severity/associated sxs/prior Treatment) HPI  [name] is a 46 y.o. female presenting to UC with c/o Left lower leg swelling and pain that is aching and sore, 7/10, worse with standing and ambulation.  Symptoms started about 1 week ago. Pain and swelling improves with rest, ice, and elevation but keeps coming back.  Two days ago she noticed a small circular bruise on the front of her lower shin.  Denies known injury.  She has taken ibuprofen with temporary relief. No hx of blood clots.    Past Medical History:  Diagnosis Date  . Anemia    resolved  . Bilateral ovarian cysts   . Kidney stone   . Migraine    Past Surgical History:  Procedure Laterality Date  . CESAREAN SECTION    . TUBAL LIGATION    . uterine ablation     Family History  Problem Relation Age of Onset  . Hypertension Mother   . Diabetes Mother   . Hypertension Father   . Cancer Father   . Heart attack Neg Hx   . Hyperlipidemia Neg Hx   . Sudden death Neg Hx    Social History  Substance Use Topics  . Smoking status: Former Games developer  . Smokeless tobacco: Never Used  . Alcohol use No     Comment: occasional   OB History    No data available     Review of Systems  Respiratory: Negative for chest tightness and shortness of breath.   Cardiovascular: Positive for leg swelling (Left lower). Negative for chest pain and palpitations.  Musculoskeletal: Positive for arthralgias and myalgias. Negative for gait problem.  Skin: Positive for color change. Negative for rash and wound.    Allergies  Morphine and related; Clindamycin/lincomycin; and Hydrocodone  Home Medications   Prior to Admission medications   Medication Sig Start Date End Date Taking? Authorizing Provider   ferrous sulfate 325 (65 FE) MG tablet Take 325 mg by mouth 2 (two) times daily with a meal.     [provider]  naproxen (NAPROSYN) 500 MG tablet Take 1 tablet (500 mg total) by mouth 2 (two) times daily. 05/18/16   Junius Finner, PA-C  ondansetron (ZOFRAN) 4 MG tablet Take 1 tablet (4 mg total) by mouth every 6 (six) hours. 04/28/16   Junius Finner, PA-C  traMADol (ULTRAM) 50 MG tablet Take 1 tablet (50 mg total) by mouth every 6 (six) hours as needed. 04/28/16   Junius Finner, PA-C   Meds Ordered and Administered this Visit  Medications - No data to display  BP 123/74 (BP Location: Left Arm)   Pulse 74   Temp 98.3 F (36.8 C) (Oral)   Resp 16   Ht 5\' 10"  (1.778 m)   Wt 192 lb (87.1 kg)   SpO2 100%   BMI 27.55 kg/m  No data found.   Physical Exam  Constitutional: She is oriented to person, place, and time. She appears well-developed and well-nourished. No distress.  HENT:  Head: Normocephalic and atraumatic.  Eyes: EOM are normal.  Neck: Normal range of motion.  Cardiovascular: Normal rate.   Pulmonary/Chest: Effort normal.  Musculoskeletal: Normal range of motion. She exhibits edema and tenderness.  Left lower leg: calf is soft non-tender. Mild to moderate  edema of lower leg and ankle. Tenderness to anterior aspect lower leg and proximal dorsal foot.  Full ROM knee and ankle.  Neurological: She is alert and oriented to person, place, and time.  Skin: Skin is warm and dry. Capillary refill takes less than 2 seconds. She is not diaphoretic. No erythema.  Left lower leg, anterior aspect: dime-sized circular area of ecchymosis. Tender. No erythema or warmth. No induration or fluctuance. Skin in tact.   Psychiatric: She has a normal mood and affect. Her behavior is normal.  Nursing note and vitals reviewed.   Urgent Care Course     Procedures (including critical care time)  Labs Review Labs Reviewed - No data to display  Imaging Review Dg Ankle Complete  Left  Result Date: 05/18/2016 CLINICAL DATA:  Lateral left ankle pain and swelling. EXAM: LEFT ANKLE COMPLETE - 3+ VIEW COMPARISON:  None. FINDINGS: There is no evidence of fracture, dislocation, or joint effusion. There is no evidence of arthropathy or other focal bone abnormality. Soft tissues are unremarkable. IMPRESSION: Negative. Electronically Signed   By: Irish LackGlenn  Yamagata M.D.   On: 05/18/2016 11:12   [place] Venous Img Lower Unilateral Left  Result Date: 05/18/2016 CLINICAL DATA:  Left lower leg and ankle pain and swelling. EXAM: Left LOWER EXTREMITY VENOUS DOPPLER ULTRASOUND TECHNIQUE: Gray-scale sonography with graded compression, as well as color Doppler and duplex ultrasound were performed to evaluate the lower extremity deep venous systems from the level of the common femoral vein and including the common femoral, femoral, profunda femoral, popliteal and calf veins including the posterior tibial, peroneal and gastrocnemius veins when visible. The superficial great saphenous vein was also interrogated. Spectral Doppler was utilized to evaluate flow at rest and with distal augmentation maneuvers in the common femoral, femoral and popliteal veins. COMPARISON:  None. FINDINGS: Contralateral Common Femoral Vein: Respiratory phasicity is normal and symmetric with the symptomatic side. No evidence of thrombus. Normal compressibility. Common Femoral Vein: No evidence of thrombus. Normal compressibility, respiratory phasicity and response to augmentation. Saphenofemoral Junction: No evidence of thrombus. Normal compressibility and flow on color Doppler imaging. Profunda Femoral Vein: No evidence of thrombus. Normal compressibility and flow on color Doppler imaging. Femoral Vein: No evidence of thrombus. Normal compressibility, respiratory phasicity and response to augmentation. Popliteal Vein: No evidence of thrombus. Normal compressibility, respiratory phasicity and response to augmentation. Calf Veins: No  evidence of thrombus. Normal compressibility and flow on color Doppler imaging. Superficial Great Saphenous Vein: No evidence of thrombus. Normal compressibility and flow on color Doppler imaging. Venous Reflux:  None. Other Findings:  None. IMPRESSION: No evidence of DVT within the left lower extremity. Electronically Signed   By: Signa Kellaylor  Stroud M.D.   On: 05/18/2016 13:10   Dg Foot Complete Left  Result Date: 05/18/2016 CLINICAL DATA:  Left ankle and foot pain and swelling. EXAM: LEFT FOOT - COMPLETE 3+ VIEW COMPARISON:  None. FINDINGS: There is no evidence of fracture or dislocation. There is no evidence of arthropathy or other focal bone abnormality. Soft tissues are unremarkable. IMPRESSION: Negative. Electronically Signed   By: Irish LackGlenn  Yamagata M.D.   On: 05/18/2016 11:12     MDM   1. Pain and swelling of left lower leg    Pain and swelling with small ecchymotic appearing lesion on Left lower leg.  No known injury.  Imaging unremarkable. Reassured pt of normal imaging. Encouraged rest, ice, compression (ace wrap applied in UC), and elevation.  Rx: Naproxen F/u with Sports Medicine in 1-2 weeks  as needed if not improving.     Junius Finner, PA-C 05/18/16 1329

## 2016-06-27 ENCOUNTER — Encounter: Payer: Self-pay | Admitting: Emergency Medicine

## 2016-06-27 ENCOUNTER — Emergency Department (INDEPENDENT_AMBULATORY_CARE_PROVIDER_SITE_OTHER)
Admission: EM | Admit: 2016-06-27 | Discharge: 2016-06-27 | Disposition: A | Discharge status: Home / Self Care | Payer: 59 | Source: Home / Self Care | Attending: Family Medicine | Admitting: Family Medicine

## 2016-06-27 DIAGNOSIS — K0889 Other specified disorders of teeth and supporting structures: Secondary | ICD-10-CM

## 2016-06-27 MED ORDER — TRAMADOL HCL 50 MG PO TABS: 50.0000 mg | ORAL_TABLET | Freq: Four times a day (QID) | ORAL | 0 refills | Status: DC | PRN

## 2016-06-27 MED ORDER — METRONIDAZOLE 500 MG PO TABS: ORAL_TABLET | ORAL | 0 refills | Status: DC

## 2016-06-27 NOTE — ED Triage Notes (Signed)
Pt c/o possible sinus infection, abscess in mouth/tooth, had 2 teeth pulled about one month ago, having pain, feels all of this is related.

## 2016-06-27 NOTE — Discharge Instructions (Signed)
Continue amoxicillin as prescribed. May take Tylenol with tramadol if needed. If symptoms become significantly worse during the night or over the weekend, proceed to the local emergency room.

## 2016-06-27 NOTE — ED Provider Notes (Signed)
[facility]    CSN: [id number] Arrival date & time: 06/27/16  1125     History   Chief Complaint Chief Complaint  Patient presents with  . Facial Pain  . Dental Pain    HPI [name] is a 46 y.o. female.   Patient underwent two tooth extractions in her left upper mouth one month ago, and will be seen again by her dentist in July.  Over the past several days she has developed increased pain in her left upper gingiva and face.  Her mother died two days ago and she has been crying frequently resulting in increased sinus congestion.  She wonders in she could have a sinus infection.  No fevers, chills, and sweats. She had a standing prescription for amoxicillin 875mg  BID from her dentist, and she began taking yesterday (3 doses thus far).    The history is provided by the patient.  Dental Pain  Location:  Upper Quality:  Aching, constant and pressure-like Severity:  Moderate Onset quality:  Gradual Duration:  2 days Timing:  Constant Progression:  Unchanged Chronicity:  Recurrent Context: dental caries, poor dentition and recent dental surgery   Relieved by:  Nothing Worsened by:  Touching and pressure Ineffective treatments:  None tried Associated symptoms: congestion, facial pain and gum swelling   Associated symptoms: no difficulty swallowing, no drooling, no facial swelling, no fever, no headaches, no neck pain, no neck swelling, no oral bleeding, no oral lesions and no trismus   Risk factors: lack of dental care and periodontal disease   Risk factors: no smoking     Past Medical History:  Diagnosis Date  . Anemia    resolved  . Bilateral ovarian cysts   . Kidney stone   . Migraine     Patient Active Problem List   Diagnosis Date Noted  . Left knee pain 09/23/2011  . Right knee pain 08/02/2011  . Low back pain 08/02/2011    Past Surgical History:  Procedure Laterality Date  . CESAREAN SECTION    . TUBAL LIGATION    . uterine ablation        OB History    No data available       Home Medications    Prior to Admission medications   Medication Sig Start Date End Date Taking? Authorizing Provider  ferrous sulfate 325 (65 FE) MG tablet Take 325 mg by mouth 2 (two) times daily with a meal.     [provider]  metroNIDAZOLE (FLAGYL) 500 MG tablet Take one tab by mouth every 12 hours for 7 days. 06/27/16   Lattie Haw, MD  traMADol (ULTRAM) 50 MG tablet Take 1 tablet (50 mg total) by mouth every 6 (six) hours as needed for moderate pain. 06/27/16   Lattie Haw, MD    Family History Family History  Problem Relation Age of Onset  . Hypertension Mother   . Diabetes Mother   . Hypertension Father   . Cancer Father   . Heart attack Neg Hx   . Hyperlipidemia Neg Hx   . Sudden death Neg Hx     Social History Social History  Substance Use Topics  . Smoking status: Former Games developer  . Smokeless tobacco: Never Used  . Alcohol use No     Comment: occasional     Allergies   Morphine and related; Clindamycin/lincomycin; and Hydrocodone   Review of Systems Review of Systems  Constitutional: Negative for fever.  HENT: Positive for congestion.  Negative for drooling, facial swelling and mouth sores.   Musculoskeletal: Negative for neck pain.  Neurological: Negative for headaches.  All other systems reviewed and are negative.    Physical Exam Triage Vital Signs ED Triage Vitals [06/27/16 1216]  Enc Vitals Group     BP (!) 148/81     Pulse Rate 77     Resp      Temp 98.4 F (36.9 C)     Temp Source Oral     SpO2 99 %     Weight 188 lb 8 oz (85.5 kg)     Height 5\' 10"  (1.778 m)     Head Circumference      Peak Flow      Pain Score 8     Pain Loc      Pain Edu?      Excl. in GC?    No data found.   Updated Vital Signs BP (!) 148/81 (BP Location: Left Arm)   Pulse 77   Temp 98.4 F (36.9 C) (Oral)   Ht 5\' 10"  (1.778 m)   Wt 188 lb 8 oz (85.5 kg)   SpO2 99%   BMI 27.05 kg/m    Visual Acuity Right Eye Distance:   Left Eye Distance:   Bilateral Distance:    Right Eye Near:   Left Eye Near:    Bilateral Near:     Physical Exam  Constitutional: She appears well-developed and well-nourished. No distress.  HENT:  Head: Atraumatic.  Right Ear: Tympanic membrane, external ear and ear canal normal.  Left Ear: Tympanic membrane, external ear and ear canal normal.  Nose: Nose normal.  Mouth/Throat:    Multiple eroded teeth left upper mouth.  Gingiva tender to palpation but not fluctuant.  Eyes: Conjunctivae and EOM are normal. Pupils are equal, round, and reactive to light.  Neck: Neck supple.  Cardiovascular: Normal rate.   Pulmonary/Chest: Effort normal.  Lymphadenopathy:    She has no cervical adenopathy.  Neurological: She is alert.  Skin: Skin is warm and dry.  Nursing note and vitals reviewed.    UC Treatments / Results  Labs (all labs ordered are listed, but only abnormal results are displayed) Labs Reviewed - No data to display  EKG  EKG Interpretation None       Radiology No results found.  Procedures Procedures (including critical care time)  Medications Ordered in UC Medications - No data to display   Initial Impression / Assessment and Plan / UC Course  I have reviewed the triage vital signs and the nursing notes.  Pertinent labs & imaging results that were available during my care of the patient were reviewed by me and considered in my medical decision making (see chart for details).    Continue amoxicillin as prescribed.  Add Flagyl 500mg  BID Rx for Tramadol. May take Tylenol with tramadol if needed. If symptoms become significantly worse during the night or over the weekend, proceed to the local emergency room.  Followup with dentist as scheduled.    Final Clinical Impressions(s) / UC Diagnoses   Final diagnoses:  Pain, dental    New Prescriptions New Prescriptions   METRONIDAZOLE (FLAGYL) 500 MG TABLET     Take one tab by mouth every 12 hours for 7 days.   TRAMADOL (ULTRAM) 50 MG TABLET    Take 1 tablet (50 mg total) by mouth every 6 (six) hours as needed for moderate pain.     Lattie HawBeese, Ellionna Buckbee A, MD 06/27/16  1324  

## 2016-07-23 ENCOUNTER — Encounter (HOSPITAL_COMMUNITY): Payer: Self-pay | Admitting: Family

## 2016-07-23 ENCOUNTER — Ambulatory Visit (HOSPITAL_COMMUNITY)
Admission: EM | Admit: 2016-07-23 | Discharge: 2016-07-23 | Disposition: A | Discharge status: Home / Self Care | Payer: 59 | Attending: Family | Admitting: Family

## 2016-07-23 DIAGNOSIS — K047 Periapical abscess without sinus: Secondary | ICD-10-CM

## 2016-07-23 MED ORDER — TRAMADOL HCL 50 MG PO TABS: 50.0000 mg | ORAL_TABLET | Freq: Four times a day (QID) | ORAL | 0 refills | Status: AC | PRN

## 2016-07-23 MED ORDER — METRONIDAZOLE 500 MG PO TABS: 500.0000 mg | ORAL_TABLET | Freq: Two times a day (BID) | ORAL | 0 refills | Status: AC

## 2016-07-23 MED ORDER — AMOXICILLIN-POT CLAVULANATE 875-125 MG PO TABS: 1.0000 | ORAL_TABLET | Freq: Two times a day (BID) | ORAL | 0 refills | Status: DC

## 2016-07-23 NOTE — ED Triage Notes (Signed)
[facility] a [facility] pain and sinus pressure x 2 days.

## 2016-07-23 NOTE — Discharge Instructions (Signed)
Please start with antibiotics as we discussed.  Ensure to take probiotics while on antibiotics and also for 2 weeks after completion. It is important to re-colonize the gut with good bacteria and also to prevent any diarrheal infections associated with antibiotic use.  As discussed  concerns abscess may not respond to antibiotics and could further spread to become systemic infection  Please return in 1-2 days to be checked again in urgent care to ensure the antibiotics are working appropriately Any new or worsening symptoms including fever, oral pain, please present earlier.

## 2016-07-23 NOTE — ED Provider Notes (Signed)
CSN: [id number]     Arrival date & time 07/23/16  1003 History   First MD Initiated Contact with Patient 07/23/16 1028     Chief Complaint  Patient presents with  . Dental Pain   (Consider location/radiation/quality/duration/timing/severity/associated sxs/prior Treatment) Chief complaint of sinus pain, dental pain 2 days, worsening.  Has been taking ibuprofen 800mg  with some relief. Tyelonol earlier this morning for fever.  Left facial pain, 'mild' headache, fever ( tmax 102 today x one day).  'Not worse headache of life.' Symptoms had resolved with prior antibiotic course.   Notes h/o pica eating starch, clay when pregnant 13 years ago. Currently following with oral surgeon and 2 teeth extracted upper left one month ago; given clindamycin from surgeon which she didn't tolerate, started on amoxcillin 875 ( ? Augmentin). Sees oral surgeon again in one month.   6/24 seen urgent care after tooth extractions; taken amoxicillin 875 mg BID from dentist. Advised to continue amoxicillin; added flagyl 500mg  bid. rx for tramadol    Crt 0.62 08/2014  Past Medical History:  Diagnosis Date  . Anemia    resolved  . Bilateral ovarian cysts   . Kidney stone   . Migraine    Past Surgical History:  Procedure Laterality Date  . CESAREAN SECTION    . TUBAL LIGATION    . uterine ablation     Family History  Problem Relation Age of Onset  . Hypertension Mother   . Diabetes Mother   . Hypertension Father   . Cancer Father   . Heart attack Neg Hx   . Hyperlipidemia Neg Hx   . Sudden death Neg Hx    Social History  Substance Use Topics  . Smoking status: Former Games developermoker  . Smokeless tobacco: Never Used  . Alcohol use No     Comment: occasional   OB History    No data available     Review of Systems  Constitutional: Positive for fever. Negative for chills.  HENT: Positive for congestion and sinus pain. Negative for ear pain, facial swelling, mouth sores, postnasal drip, sore throat and  trouble swallowing.   Eyes: Negative for visual disturbance.  Respiratory: Negative for cough and shortness of breath.   Cardiovascular: Negative for chest pain and palpitations.  Gastrointestinal: Negative for nausea and vomiting.  Neurological: Negative for headaches.    Allergies  Morphine and related; Clindamycin/lincomycin; and Hydrocodone  Home Medications   Prior to Admission medications   Medication Sig Start Date End Date Taking? Authorizing Provider  ferrous sulfate 325 (65 FE) MG tablet Take 325 mg by mouth 2 (two) times daily with a meal.    Yes [provider]  amoxicillin-clavulanate (AUGMENTIN) 875-125 MG tablet Take 1 tablet by mouth every 12 (twelve) hours. 07/23/16   Allegra GranaArnett, Camerin Ladouceur G, FNP  metroNIDAZOLE (FLAGYL) 500 MG tablet Take 1 tablet (500 mg total) by mouth 2 (two) times daily. 07/23/16   Allegra GranaArnett, Nakema Fake G, FNP  traMADol (ULTRAM) 50 MG tablet Take 1 tablet (50 mg total) by mouth every 6 (six) hours as needed. 07/23/16   Allegra GranaArnett, Christobal Morado G, FNP   Meds Ordered and Administered this Visit  Medications - No data to display  BP 128/79 (BP Location: Right Arm)   Pulse 72   Temp 98.9 F (37.2 C) (Oral)   Resp 16   SpO2 100%  No data found.   Physical Exam  Constitutional: She appears well-developed and well-nourished.  HENT:  Head: Normocephalic and atraumatic.  Right Ear:  Hearing, tympanic membrane, external ear and ear canal normal. No drainage, swelling or tenderness. No foreign bodies. Tympanic membrane is not erythematous and not bulging. No middle ear effusion. No decreased hearing is noted.  Left Ear: Hearing, tympanic membrane, external ear and ear canal normal. No drainage, swelling or tenderness. No foreign bodies. Tympanic membrane is not erythematous and not bulging.  No middle ear effusion. No decreased hearing is noted.  Nose: No rhinorrhea. Right sinus exhibits no maxillary sinus tenderness and no frontal sinus tenderness. Left sinus  exhibits maxillary sinus tenderness. Left sinus exhibits no frontal sinus tenderness.  Mouth/Throat: Uvula is midline, oropharynx is clear and moist and mucous membranes are normal. Dental caries present. No dental abscesses. No oropharyngeal exudate, posterior oropharyngeal edema, posterior oropharyngeal erythema or tonsillar abscesses.    Missing dentition as noted on diagram. No foul-smelling, purulent discharge noted on exam. Tenderness with palpation of gums. No gross swelling appreciated.  Eyes: Conjunctivae are normal.  Cardiovascular: Regular rhythm, normal heart sounds and normal pulses.   Pulmonary/Chest: Effort normal and breath sounds normal. She has no wheezes. She has no rhonchi. She has no rales.  Lymphadenopathy:       Head (right side): No submental, no submandibular, no tonsillar, no preauricular, no posterior auricular and no occipital adenopathy present.       Head (left side): No submental, no submandibular, no tonsillar, no preauricular, no posterior auricular and no occipital adenopathy present.    She has no cervical adenopathy.  Neurological: She is alert.  Skin: Skin is warm and dry.  Psychiatric: She has a normal mood and affect. Her speech is normal and behavior is normal. Thought content normal.  Vitals reviewed.   Urgent Care Course     Procedures (including critical care time)        MDM   1. Dental infection    Patient is afebrile in clinic today. She is well-appearing. Prior antibiotics including Augmentin and Flagyl had worked well for patient past. Maryclare Labrador restart his medications today. I have given strict return precautions asked patient to return to clinic 1-2 days. We also discussed the risk at this infection becoming more systemic. Patient understands importance of close follow up with our clinic as well as her oral surgeon. Patient does appear to be in obvious pain on left side of face and has trouble eating, it is reasonable to start short course  tramadol. I looked up patient on [place] Controlled Substances Reporting System and saw no activity that raised concern of inappropriate use.      Allegra Grana, FNP 07/23/16 1102

## 2016-10-04 ENCOUNTER — Other Ambulatory Visit (HOSPITAL_BASED_OUTPATIENT_CLINIC_OR_DEPARTMENT_OTHER): Payer: Self-pay | Admitting: Emergency Medicine

## 2016-10-04 DIAGNOSIS — M79605 Pain in left leg: Secondary | ICD-10-CM

## 2016-10-09 ENCOUNTER — Ambulatory Visit (HOSPITAL_BASED_OUTPATIENT_CLINIC_OR_DEPARTMENT_OTHER): Admission: RE | Admit: 2016-10-09 | Payer: 59 | Source: Ambulatory Visit

## 2016-10-11 ENCOUNTER — Other Ambulatory Visit (HOSPITAL_BASED_OUTPATIENT_CLINIC_OR_DEPARTMENT_OTHER): Payer: 59

## 2016-10-12 ENCOUNTER — Ambulatory Visit (HOSPITAL_BASED_OUTPATIENT_CLINIC_OR_DEPARTMENT_OTHER): Payer: 59

## 2016-11-07 ENCOUNTER — Encounter (HOSPITAL_BASED_OUTPATIENT_CLINIC_OR_DEPARTMENT_OTHER): Payer: Self-pay | Admitting: *Deleted

## 2016-11-07 ENCOUNTER — Emergency Department (HOSPITAL_BASED_OUTPATIENT_CLINIC_OR_DEPARTMENT_OTHER)
Admission: EM | Admit: 2016-11-07 | Discharge: 2016-11-07 | Disposition: A | Discharge status: Home / Self Care | Payer: 59 | Attending: Emergency Medicine | Admitting: Emergency Medicine

## 2016-11-07 DIAGNOSIS — K029 Dental caries, unspecified: Secondary | ICD-10-CM | POA: Diagnosis not present

## 2016-11-07 DIAGNOSIS — J3489 Other specified disorders of nose and nasal sinuses: Secondary | ICD-10-CM

## 2016-11-07 DIAGNOSIS — Z87891 Personal history of nicotine dependence: Secondary | ICD-10-CM | POA: Insufficient documentation

## 2016-11-07 DIAGNOSIS — K047 Periapical abscess without sinus: Secondary | ICD-10-CM | POA: Diagnosis not present

## 2016-11-07 DIAGNOSIS — K0889 Other specified disorders of teeth and supporting structures: Secondary | ICD-10-CM | POA: Diagnosis present

## 2016-11-07 MED ORDER — AMOXICILLIN-POT CLAVULANATE 875-125 MG PO TABS
1.0000 | ORAL_TABLET | Freq: Once | ORAL | Status: AC
  Administered 2016-11-07: 1 via ORAL
  Filled 2016-11-07: qty 1

## 2016-11-07 MED ORDER — AMOXICILLIN-POT CLAVULANATE 875-125 MG PO TABS: 1.0000 | ORAL_TABLET | Freq: Two times a day (BID) | ORAL | 0 refills | Status: AC

## 2016-11-07 NOTE — ED Triage Notes (Signed)
Pt states for two days she has had left sided dental pain, headache, and sinus pain. Pt took 800mg  ibuprofen and tylenol.

## 2016-11-07 NOTE — ED Notes (Signed)
Alert, NAD, calm, interactive, resps e/u, speaking in clear complete sentences, no dyspnea noted, skin W&D, VSS, c/o maxillary sinus and L upper dental pain, caries and poor dentition noted, (denies: fever, NVD, congestion, cough, cold sx, sore throat, ear sx, sob, dizziness or visual changes).

## 2016-11-07 NOTE — ED Provider Notes (Signed)
[facility] EMERGENCY DEPARTMENT Provider Note  CSN: [id number] Arrival date & time: 11/07/16 1922  Chief Complaint(s) Headache  HPI [name] is a 46 y.o. female with a history of poor dentition and recurrent dental infections presents to the emergency department with left-sided dental pain with associated swelling.  Patient also endorsing sinus pressure and frontal headache.  Pain is exacerbated with eating and palpation of the left jaw.  She endorses subjective fevers, no chills, no difficulty swallowing, throat swelling, change in phonation, difficulty breathing.  No chest pain.  No visual disturbances.  No focal weakness.  Denies any other physical complaints.  Denies any other alleviating or aggravating factors.  HPI  Past Medical History Past Medical History:  Diagnosis Date  . Anemia    resolved  . Bilateral ovarian cysts   . Kidney stone   . Migraine    Patient Active Problem List   Diagnosis Date Noted  . Left knee pain 09/23/2011  . Right knee pain 08/02/2011  . Low back pain 08/02/2011   Home Medication(s) Prior to Admission medications   Medication Sig Start Date End Date Taking? Authorizing Provider  amoxicillin-clavulanate (AUGMENTIN) 875-125 MG tablet Take 1 tablet every 12 (twelve) hours for 7 days by mouth. 11/07/16 11/14/16  Nira Conn, MD  ferrous sulfate 325 (65 FE) MG tablet Take 325 mg by mouth 2 (two) times daily with a meal.     [provider]  metroNIDAZOLE (FLAGYL) 500 MG tablet Take 1 tablet (500 mg total) by mouth 2 (two) times daily. 07/23/16   Allegra Grana, FNP  traMADol (ULTRAM) 50 MG tablet Take 1 tablet (50 mg total) by mouth every 6 (six) hours as needed. 07/23/16   Allegra Grana, FNP                                                                                                                                    Past Surgical History Past Surgical History:  Procedure Laterality Date  . CESAREAN  SECTION    . TUBAL LIGATION    . uterine ablation     Family History Family History  Problem Relation Age of Onset  . Hypertension Mother   . Diabetes Mother   . Hypertension Father   . Cancer Father   . Heart attack Neg Hx   . Hyperlipidemia Neg Hx   . Sudden death Neg Hx     Social History Social History   Tobacco Use  . Smoking status: Former Games developer  . Smokeless tobacco: Never Used  Substance Use Topics  . Alcohol use: No    Comment: occasional  . Drug use: No   Allergies Morphine and related; Clindamycin/lincomycin; and Hydrocodone  Review of Systems Review of Systems All other systems are reviewed and are negative for acute change except as noted in the HPI  Physical Exam Vital Signs  I have reviewed the triage vital  signs BP 127/84 (BP Location: Right Arm)   Pulse 93   Temp 99 F (37.2 C)   Resp 16   Ht 5\' 10"  (1.778 m)   Wt 83.9 kg (185 lb)   SpO2 99%   BMI 26.54 kg/m   Physical Exam  Constitutional: She is oriented to person, place, and time. She appears well-developed and well-nourished. No distress.  HENT:  Head: Normocephalic and atraumatic.  Right Ear: External ear normal.  Left Ear: External ear normal.  Nose: Rhinorrhea present. Right sinus exhibits maxillary sinus tenderness and frontal sinus tenderness. Left sinus exhibits maxillary sinus tenderness and frontal sinus tenderness.  Mouth/Throat: No oral lesions. No trismus in the jaw. Abnormal dentition ( Numerous teeth with decay in the upper and lower regions.). Dental caries present. No dental abscesses or uvula swelling.    Postnasal drip  Eyes: Conjunctivae and EOM are normal. No scleral icterus.  Neck: Normal range of motion and phonation normal.  Cardiovascular: Normal rate and regular rhythm.  Pulmonary/Chest: Effort normal. No stridor. No respiratory distress.  Abdominal: She exhibits no distension.  Musculoskeletal: Normal range of motion. She exhibits no edema.  Neurological:  She is alert and oriented to person, place, and time.  Skin: She is not diaphoretic.  Psychiatric: She has a normal mood and affect. Her behavior is normal.  Vitals reviewed.   ED Results and Treatments Labs (all labs ordered are listed, but only abnormal results are displayed) Labs Reviewed - No data to display                                                                                                                       EKG  EKG Interpretation  Date/Time:    Ventricular Rate:    PR Interval:    QRS Duration:   QT Interval:    QTC Calculation:   R Axis:     Text Interpretation:        Radiology No results found. Pertinent labs & imaging results that were available during my care of the patient were reviewed by me and considered in my medical decision making (see chart for details).  Medications Ordered in ED Medications  amoxicillin-clavulanate (AUGMENTIN) 875-125 MG per tablet 1 tablet (not administered)  Procedures Procedures  (including critical care time)  Medical Decision Making / ED Course I have reviewed the nursing notes for this encounter and the patient's prior records (if available in EHR or on provided paperwork).    Patient is afebrile, with stable vital signs.  Well-appearing, well-hydrated, nontoxic.  Presentation is consistent with recurrent dental infections due to poor dentition.  No evidence of Ludwig's angina or deep tissue infection.  Will treat with outpatient antibiotics.  She was instructed to follow-up closely with her oral surgeon.  The patient is safe for discharge with strict return precautions.   Final Clinical Impression(s) / ED Diagnoses Final diagnoses:  Sinus pressure  Dental infection  Tooth decay    Disposition: Discharge  Condition: Good  I have discussed the results, Dx and Tx plan with  the patient who expressed understanding and agree(s) with the plan. Discharge instructions discussed at great length. The patient was given strict return precautions who verbalized understanding of the instructions. No further questions at time of discharge.    This SmartLink is deprecated. Use AVSMEDLIST instead to display the medication list for a patient.  Follow Up: Oral surgeon  Schedule an appointment as soon as possible for a visit        This chart was dictated using voice recognition software.  Despite best efforts to proofread,  errors can occur which can change the documentation meaning.   Nira Connardama, Sora Vrooman Eduardo, MD 11/07/16 1949

## 2016-11-07 NOTE — ED Notes (Signed)
EDP into room, prior to RN assessment, see MD notes, orders received to medicate and d/c. Care assumed at time of d/c.   

## 2017-11-28 ENCOUNTER — Encounter (HOSPITAL_BASED_OUTPATIENT_CLINIC_OR_DEPARTMENT_OTHER): Payer: Self-pay | Admitting: Emergency Medicine

## 2017-11-28 ENCOUNTER — Other Ambulatory Visit: Payer: Self-pay

## 2017-11-28 ENCOUNTER — Emergency Department (HOSPITAL_BASED_OUTPATIENT_CLINIC_OR_DEPARTMENT_OTHER)
Admission: EM | Admit: 2017-11-28 | Discharge: 2017-11-28 | Disposition: A | Discharge status: Home / Self Care | Payer: 59 | Attending: Emergency Medicine | Admitting: Emergency Medicine

## 2017-11-28 DIAGNOSIS — M25561 Pain in right knee: Secondary | ICD-10-CM | POA: Insufficient documentation

## 2017-11-28 DIAGNOSIS — Z87891 Personal history of nicotine dependence: Secondary | ICD-10-CM | POA: Diagnosis not present

## 2017-11-28 HISTORY — DX: Lymphedema, not elsewhere classified: I89.0
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
## 2017-11-28 NOTE — Discharge Instructions (Signed)
Please elevate and ice the knee to help with pain and swelling Use a knee sleeve to help with swelling Follow up with orthopedics Return if you are worsening
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
## 2018-04-05 ENCOUNTER — Emergency Department (HOSPITAL_BASED_OUTPATIENT_CLINIC_OR_DEPARTMENT_OTHER)
Admission: EM | Admit: 2018-04-05 | Discharge: 2018-04-05 | Disposition: A | Discharge status: Home / Self Care | Payer: 59 | Attending: Emergency Medicine | Admitting: Emergency Medicine

## 2018-04-05 ENCOUNTER — Encounter (HOSPITAL_BASED_OUTPATIENT_CLINIC_OR_DEPARTMENT_OTHER): Payer: Self-pay | Admitting: Emergency Medicine

## 2018-04-05 ENCOUNTER — Other Ambulatory Visit: Payer: Self-pay

## 2018-04-05 DIAGNOSIS — Z87891 Personal history of nicotine dependence: Secondary | ICD-10-CM | POA: Insufficient documentation

## 2018-04-05 DIAGNOSIS — M25562 Pain in left knee: Secondary | ICD-10-CM | POA: Insufficient documentation

## 2018-04-05 DIAGNOSIS — R509 Fever, unspecified: Secondary | ICD-10-CM | POA: Diagnosis not present

## 2018-04-05 DIAGNOSIS — J0100 Acute maxillary sinusitis, unspecified: Secondary | ICD-10-CM | POA: Diagnosis not present

## 2018-04-05 DIAGNOSIS — J3489 Other specified disorders of nose and nasal sinuses: Secondary | ICD-10-CM | POA: Diagnosis present

## 2018-04-05 MED ORDER — AZITHROMYCIN 250 MG PO TABS: 250.0000 mg | ORAL_TABLET | Freq: Every day | ORAL | 0 refills | Status: AC

## 2018-04-05 MED FILL — AZITHROMYCIN 250 MG TABLET: 250 | 5 days supply | Qty: 6 | Fill #0
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
## 2018-04-05 NOTE — ED Triage Notes (Signed)
Sinus pressure x 1 week. Hx of frequent sinus infection

## 2018-04-05 NOTE — Discharge Instructions (Signed)
I am starting you on antibiotics for your sinus infection. Call the orthopedic doctor if your knee pain worsens. Return to the ED with any new or worsening symptoms.

## 2018-04-19 IMAGING — US US VENOUS IMAG BI/LT/[ID]
1 series · 13 of 24 positions shown · non-contrast
Comparison: None.

CLINICAL DATA: Left lower leg and ankle pain and swelling.



[Series 1: us venous imag bi/left/(id) · 0.06mm/px · 13 of 28 slices shown]
[im 1/28]
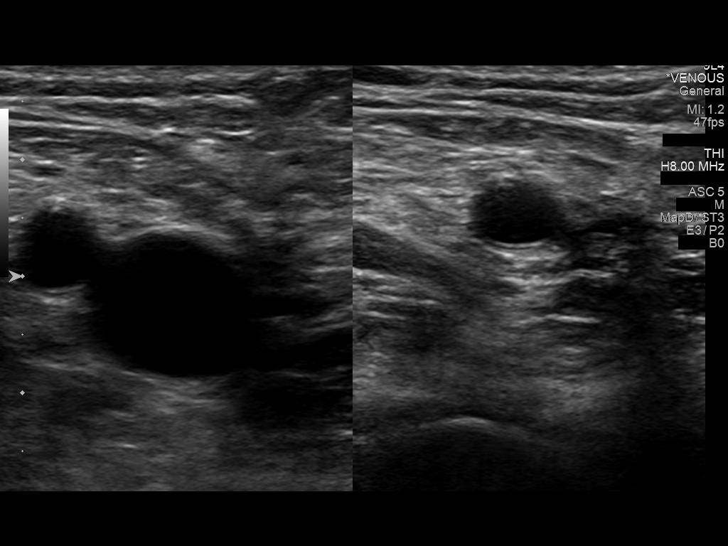
[im 3/28]
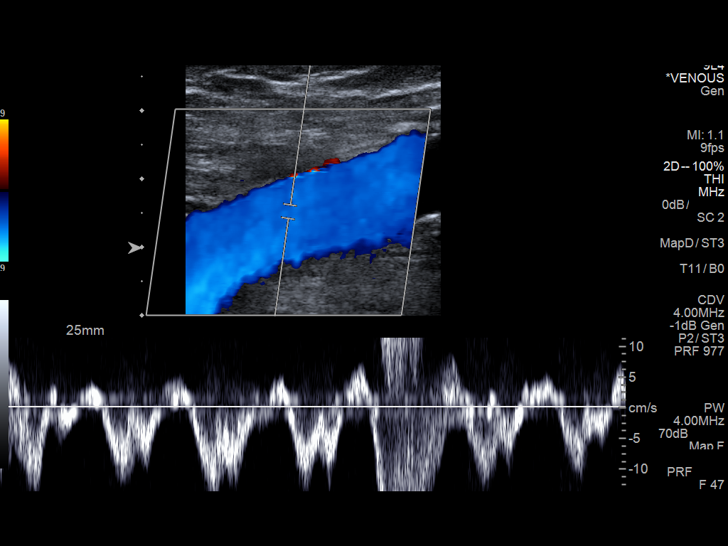
[im 5/28]
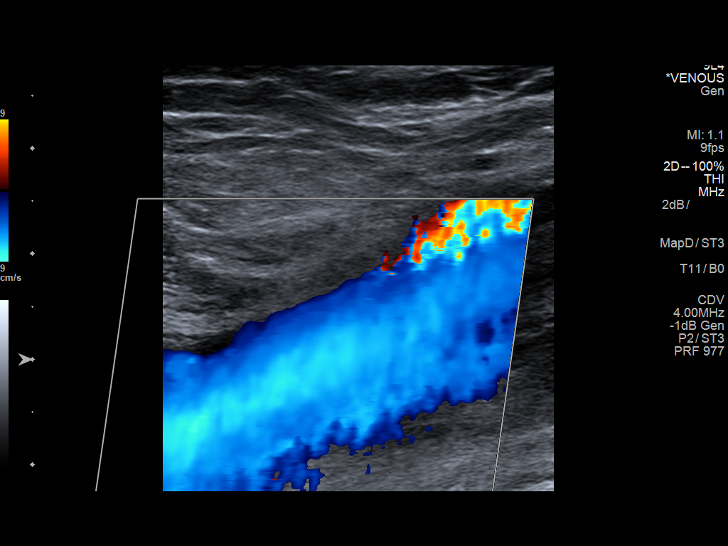
[im 8/28]
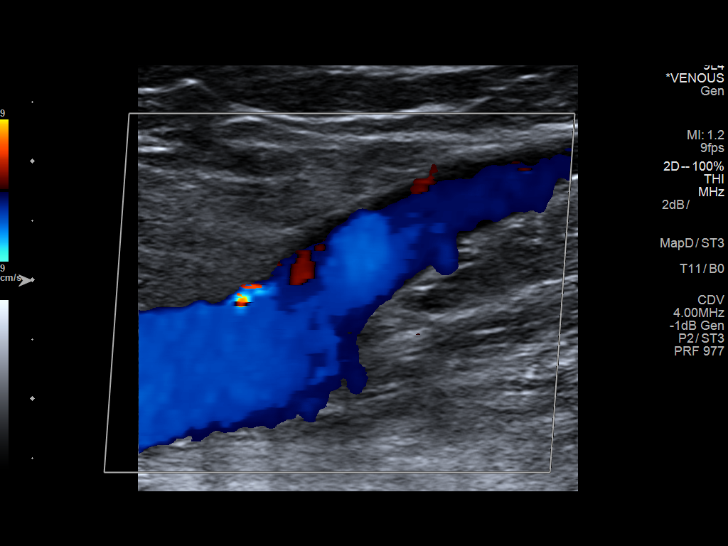
[im 10/28]
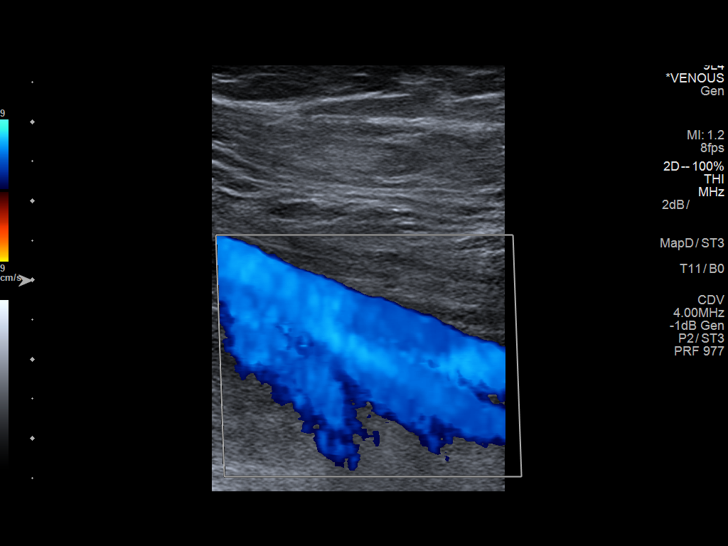
[im 12/28]
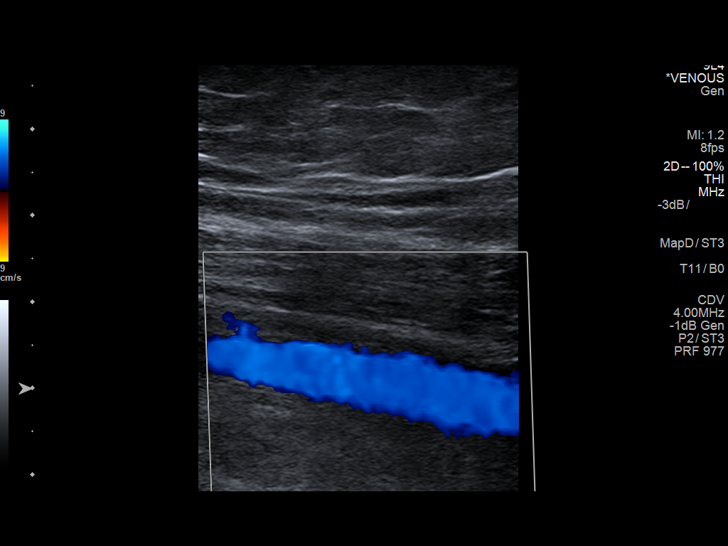
[im 15/28]
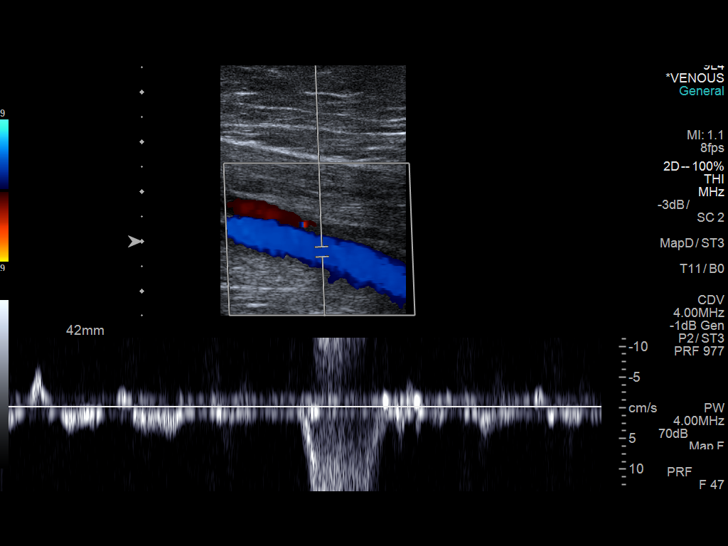
[im 16/28]
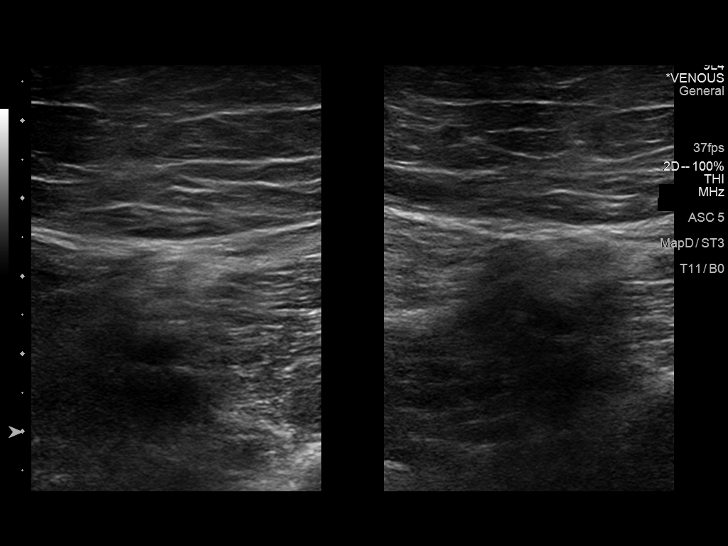
[im 18/28]
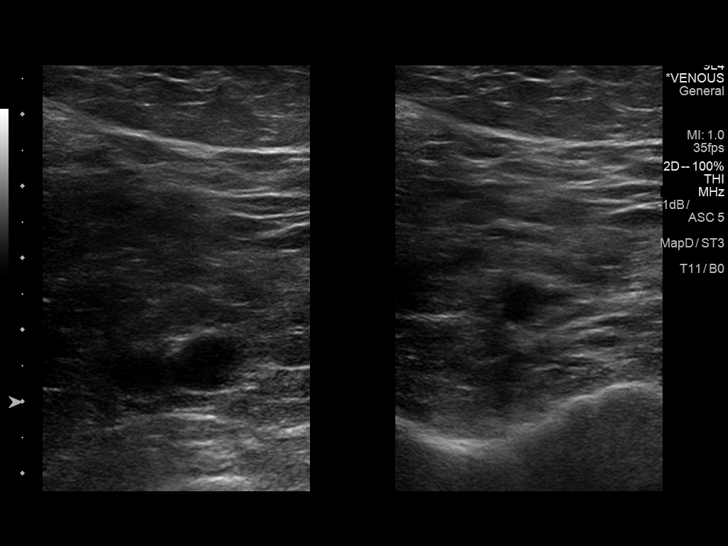
[im 20/28]
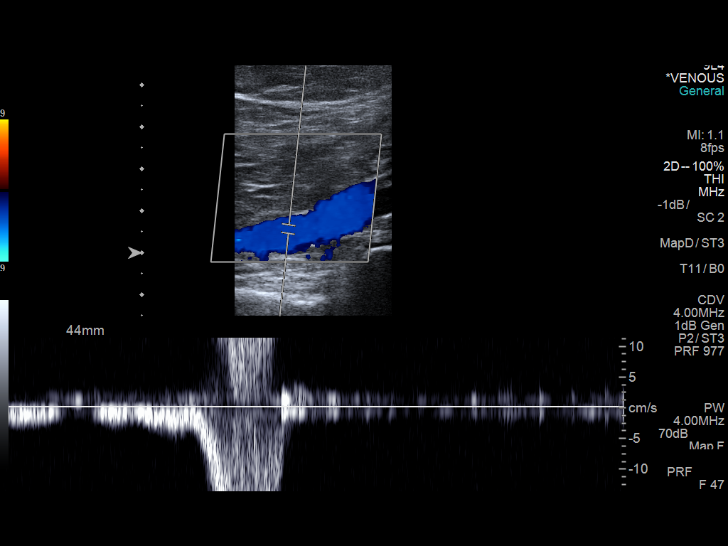
[im 23/28]
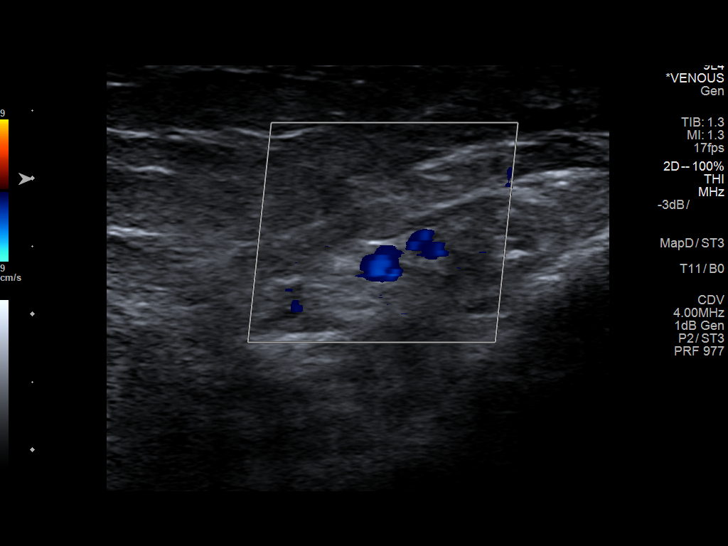
[im 25/28]
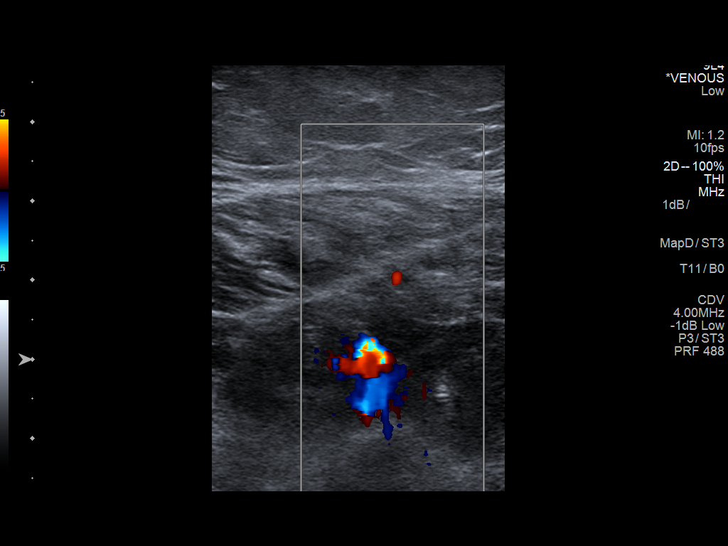
[im 28/28]
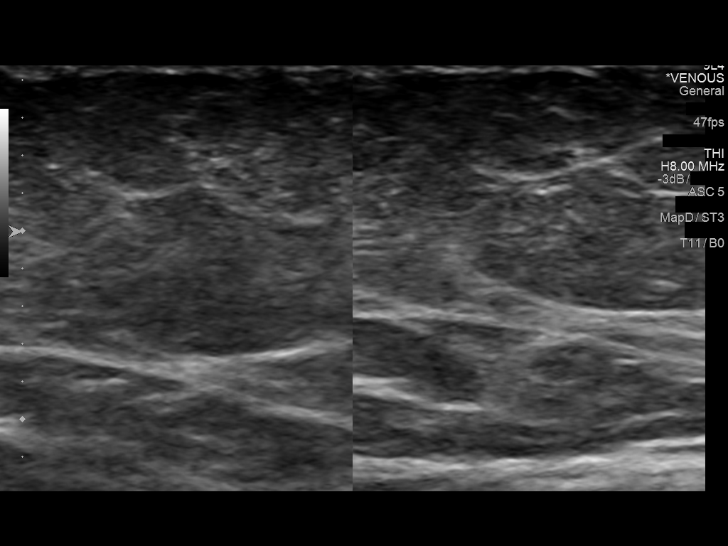

[13 of 24 positions shown; findings below may reference images not displayed]

FINDINGS: Contralateral Common Femoral Vein: Respiratory phasicity is normal
and symmetric with the symptomatic side. No evidence of thrombus.
Normal compressibility.

Common Femoral Vein: No evidence of thrombus. Normal
compressibility, respiratory phasicity and response to augmentation.

Saphenofemoral Junction: No evidence of thrombus. Normal
compressibility and flow on color Doppler imaging.

Profunda Femoral Vein: No evidence of thrombus. Normal
compressibility and flow on color Doppler imaging.

Femoral Vein: No evidence of thrombus. Normal compressibility,
respiratory phasicity and response to augmentation.

Popliteal Vein: No evidence of thrombus. Normal compressibility,
respiratory phasicity and response to augmentation.

Calf Veins: No evidence of thrombus. Normal compressibility and flow
on color Doppler imaging.

Superficial Great Saphenous Vein: No evidence of thrombus. Normal
compressibility and flow on color Doppler imaging.

Venous Reflux:  None.

Other Findings:  None.
IMPRESSION: No evidence of DVT within the left lower extremity.

## 2018-06-26 ENCOUNTER — Telehealth: Payer: Self-pay | Admitting: General Practice

## 2018-06-26 ENCOUNTER — Encounter (HOSPITAL_COMMUNITY): Payer: Self-pay | Admitting: *Deleted

## 2018-06-26 ENCOUNTER — Ambulatory Visit (HOSPITAL_COMMUNITY)
Admission: EM | Admit: 2018-06-26 | Discharge: 2018-06-26 | Disposition: A | Discharge status: Home / Self Care | Payer: 59 | Attending: Family Medicine | Admitting: Family Medicine

## 2018-06-26 ENCOUNTER — Other Ambulatory Visit: Payer: Self-pay

## 2018-06-26 DIAGNOSIS — Z20822 Contact with and (suspected) exposure to covid-19: Secondary | ICD-10-CM

## 2018-06-26 DIAGNOSIS — J069 Acute upper respiratory infection, unspecified: Secondary | ICD-10-CM

## 2018-06-26 MED ORDER — FLUTICASONE PROPIONATE 50 MCG/ACT NA SUSP
1.0000 | Freq: Every day | NASAL | 0 refills | Status: AC
Start: 2018-06-26 — End: ?

## 2018-06-26 MED ORDER — AMOXICILLIN-POT CLAVULANATE 875-125 MG PO TABS: 1.0000 | ORAL_TABLET | Freq: Two times a day (BID) | ORAL | 0 refills | Status: AC

## 2018-06-26 NOTE — Telephone Encounter (Signed)
Pt has been scheduled for covid testing.  °Pt was referred by: Wieters, Hallie C, PA-C ° °

## 2018-06-26 NOTE — Discharge Instructions (Signed)
At this time your sinus symptoms are most likely viral and need another 3-4 days to begin improving Continue daily allegra May continue using sudafed Add in flonase nasal spray 1-2 spray each nostril daily to further open sinuses  If you do not have any improvement in your symptoms with continued use of the above and another 3 to 4 days you may fill prescription for Augmentin to treat for sinus infection.  Please follow-up if symptoms not resolving or worsening, developing cough, shortness of breath, persistent fevers  Someone will be in contact with you to set up COVID testing appointment [place].

## 2018-06-26 NOTE — Addendum Note (Signed)
Addended by: Dimple Nanas on: 06/26/2018 12:27 PM   Modules accepted: Orders

## 2018-06-26 NOTE — Telephone Encounter (Signed)
-----   Message from Janith Lima, PA-C sent at 06/26/2018 11:54 AM EDT ----- Regarding: needs COVID testing URI symptoms x 5 days with low grade fevers; no known exposure

## 2018-06-26 NOTE — ED Provider Notes (Signed)
[facility]    CSN: [id number] Arrival date & time: 06/26/18  1031      History   Chief Complaint Chief Complaint  Patient presents with  . Facial Pain  . Headache    HPI [name] is a 48 y.o. female history of migraines, ovarian cyst, lymphedema presenting today for evaluation of sinus pressure, headache and nasal congestion.  Patient states that symptoms began approximately 5 days ago and have persisted.  She notes that she has had low-grade fevers in between 99 and 100.  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  States that she has history of similar and often has to take antibiotics.  She denies any cough, shortness of breath or chest pain.  Denies any tobacco use or history of asthma/COPD.  Denies any known exposure to COVID or close sick contacts.  HPI  Past Medical History:  Diagnosis Date  . Anemia    resolved  . Bilateral ovarian cysts   . Lymphedema   . Migraine     Patient Active Problem List   Diagnosis Date Noted  . Left knee pain 09/23/2011  . Right knee pain 08/02/2011  . Low back pain 08/02/2011    Past Surgical History:  Procedure Laterality Date  . CESAREAN SECTION    . TUBAL LIGATION    . uterine ablation      OB History   No obstetric history on file.      Home Medications    Prior to Admission medications   Medication Sig Start Date End Date Taking? Authorizing Provider  ACETAMINOPHEN PO Take by mouth.   Yes [provider]  GABAPENTIN PO Take by mouth.   Yes [provider]  traMADol (ULTRAM) 50 MG tablet Take 1 tablet (50 mg total) by mouth every 6 (six) hours as needed. 07/23/16  Yes Burnard Hawthorne, FNP  amoxicillin-clavulanate (AUGMENTIN) 875-125 MG tablet Take 1 tablet by mouth every 12 (twelve) hours for 7 days. 06/29/18 07/06/18  Wieters, Hallie C, PA-C  azithromycin (ZITHROMAX) 250 MG tablet Take 1 tablet (250 mg total) by mouth daily. Take first 2 tablets together, then 1 every  day until finished. 04/05/18   Long, Wonda Olds, MD  ferrous sulfate 325 (65 FE) MG tablet Take 325 mg by mouth 2 (two) times daily with a meal.     [provider]  fluticasone (FLONASE) 50 MCG/ACT nasal spray Place 1-2 sprays into both nostrils daily. 06/26/18   Wieters, Hallie C, PA-C  metroNIDAZOLE (FLAGYL) 500 MG tablet Take 1 tablet (500 mg total) by mouth 2 (two) times daily. 07/23/16   Burnard Hawthorne, FNP    Family History Family History  Problem Relation Age of Onset  . Hypertension Mother   . Diabetes Mother   . Hypertension Father   . Cancer Father   . Heart attack Neg Hx   . Hyperlipidemia Neg Hx   . Sudden death Neg Hx     Social History Social History   Tobacco Use  . Smoking status: Former Research scientist (life sciences)  . Smokeless tobacco: Never Used  Substance Use Topics  . Alcohol use: Not Currently  . Drug use: No     Allergies   Morphine and related, Clindamycin/lincomycin, and Hydrocodone   Review of Systems Review of Systems  Constitutional: Positive for fever. Negative for activity change, appetite change, chills and fatigue.  HENT: Positive for congestion, rhinorrhea and sinus pressure. Negative for ear pain, sore throat and trouble swallowing.  Eyes: Negative for discharge and redness.  Respiratory: Negative for cough, chest tightness and shortness of breath.   Cardiovascular: Negative for chest pain.  Gastrointestinal: Negative for abdominal pain, diarrhea, nausea and vomiting.  Musculoskeletal: Negative for myalgias.  Skin: Negative for rash.  Neurological: Negative for dizziness, light-headedness and headaches.     Physical Exam Triage Vital Signs ED Triage Vitals  Enc Vitals Group     BP 06/26/18 1128 (!) 153/58     Pulse Rate 06/26/18 1127 82     Resp 06/26/18 1127 16     Temp 06/26/18 1127 99.2 F (37.3 C)     Temp Source 06/26/18 1127 Temporal     SpO2 06/26/18 1127 100 %     Weight --      Height --      Head Circumference --      Peak  Flow --      Pain Score 06/26/18 1128 5     Pain Loc --      Pain Edu? --      Excl. in GC? --    No data found.  Updated Vital Signs BP (!) 153/58   Pulse 82   Temp 99.2 F (37.3 C) (Temporal) Comment: Had Tylenol 2.5 hrs ago  Resp 18   SpO2 100%   Visual Acuity Right Eye Distance:   Left Eye Distance:   Bilateral Distance:    Right Eye Near:   Left Eye Near:    Bilateral Near:     Physical Exam Vitals signs and nursing note reviewed.  Constitutional:      General: She is not in acute distress.    Appearance: She is well-developed.  HENT:     Head: Normocephalic and atraumatic.     Ears:     Comments: Bilateral ears without tenderness to palpation of external auricle, tragus and mastoid, EAC's without erythema or swelling, TM's with good bony landmarks and cone of light. Non erythematous.     Nose:     Comments: Nasal mucosa slightly erythematous with slightly swollen turbinates    Mouth/Throat:     Comments: Oral mucosa pink and moist, no tonsillar enlargement or exudate. Posterior pharynx patent and nonerythematous, no uvula deviation or swelling. Normal phonation.  Eyes:     Conjunctiva/sclera: Conjunctivae normal.  Neck:     Musculoskeletal: Neck supple.  Cardiovascular:     Rate and Rhythm: Normal rate and regular rhythm.     Heart sounds: No murmur.  Pulmonary:     Effort: Pulmonary effort is normal. No respiratory distress.     Breath sounds: Normal breath sounds.     Comments: Breathing comfortably at rest, CTABL, no wheezing, rales or other adventitious sounds auscultated Abdominal:     Palpations: Abdomen is soft.     Tenderness: There is no abdominal tenderness.  Skin:    General: Skin is warm and dry.  Neurological:     Mental Status: She is alert.      UC Treatments / Results  Labs (all labs ordered are listed, but only abnormal results are displayed) Labs Reviewed - No data to display  EKG None  Radiology No results found.   Procedures Procedures (including critical care time)  Medications Ordered in UC Medications - No data to display  Initial Impression / Assessment and Plan / UC Course  I have reviewed the triage vital signs and the nursing notes.  Pertinent labs & imaging results that were available during my care of  the patient were reviewed by me and considered in my medical decision making (see chart for details).     URI symptoms x5 days, lacking cough and lower respiratory symptoms.  Feels symptoms most likely sinusitis, but given symptoms and reported low-grade fevers will recommend COVID testing.  [facility]  Given length of symptoms x5 days recommending continued symptomatic and supportive care, continue Allegra, Sudafed, may add in Flonase to help further open sinuses.  Did provide Augmentin to fill in 3 to 4 days if still not having improvement with symptomatic and supportive care.Discussed strict return precautions. Patient verbalized understanding and is agreeable with plan.  Final Clinical Impressions(s) / UC Diagnoses   Final diagnoses:  Viral URI     Discharge Instructions     At this time your sinus symptoms are most likely viral and need another 3-4 days to begin improving Continue daily allegra May continue using sudafed Add in flonase nasal spray 1-2 spray each nostril daily to further open sinuses  If you do not have any improvement in your symptoms with continued use of the above and another 3 to 4 days you may fill prescription for Augmentin to treat for sinus infection.  Please follow-up if symptoms not resolving or worsening, developing cough, shortness of breath, persistent fevers  Someone will be in contact with you to set up COVID testing appointment [address]   ED Prescriptions    Medication Sig Dispense Auth. Provider   fluticasone (FLONASE) 50 MCG/ACT nasal spray Place 1-2 sprays into both nostrils daily. 1 g Wieters, Hallie C, PA-C    amoxicillin-clavulanate (AUGMENTIN) 875-125 MG tablet Take 1 tablet by mouth every 12 (twelve) hours for 7 days. 14 tablet Wieters, ReaderHallie C, PA-C     Controlled Substance Prescriptions [place] Controlled Substance Registry consulted? Not Applicable   Lew DawesWieters, Hallie C, [place]PA-C 06/26/18 1317

## 2018-06-26 NOTE — ED Triage Notes (Signed)
C/O sinus pressure, HA, nasal congestion since yesterday with slightly elevated temps.

## 2018-06-27 ENCOUNTER — Other Ambulatory Visit: Payer: 59

## 2018-06-27 DIAGNOSIS — Z20822 Contact with and (suspected) exposure to covid-19: Secondary | ICD-10-CM

## 2018-06-30 ENCOUNTER — Telehealth (HOSPITAL_COMMUNITY): Payer: Self-pay | Admitting: Emergency Medicine

## 2018-06-30 LAB — NOVEL CORONAVIRUS, NAA: SARS-CoV-2, NAA: NOT DETECTED

## 2018-06-30 NOTE — Telephone Encounter (Signed)
Your test for COVID-19 was negative.  Please continue good preventive care measures, including:  frequent hand-washing, avoid touching your face, cover coughs/sneezes, stay out of crowds and keep a 6 foot distance from others.  If you develop fever/cough/breathlessness, please stay home for 10 days and until you have had 3 consecutive days with cough/breathlessness improving and without fever (without taking a fever reducer). Go to the nearest hospital ED tent for assessment if fever/cough/breathlessness are severe or illness seems like a threat to life..  Patient contacted and made aware of all results, all questions answered.  

## 2018-10-04 ENCOUNTER — Other Ambulatory Visit: Payer: Self-pay

## 2018-10-04 ENCOUNTER — Ambulatory Visit (HOSPITAL_COMMUNITY)
Admission: EM | Admit: 2018-10-04 | Discharge: 2018-10-04 | Disposition: A | Discharge status: Home / Self Care | Payer: 59 | Attending: Urgent Care | Admitting: Urgent Care

## 2018-10-04 ENCOUNTER — Encounter (HOSPITAL_COMMUNITY): Payer: Self-pay

## 2018-10-04 DIAGNOSIS — K047 Periapical abscess without sinus: Secondary | ICD-10-CM | POA: Diagnosis not present

## 2018-10-04 DIAGNOSIS — R519 Headache, unspecified: Secondary | ICD-10-CM

## 2018-10-04 DIAGNOSIS — J3089 Other allergic rhinitis: Secondary | ICD-10-CM

## 2018-10-04 DIAGNOSIS — R220 Localized swelling, mass and lump, head: Secondary | ICD-10-CM

## 2018-10-04 MED ORDER — PREDNISONE 20 MG PO TABS: ORAL_TABLET | ORAL | 0 refills | Status: AC

## 2018-10-04 MED ORDER — AMOXICILLIN-POT CLAVULANATE 875-125 MG PO TABS: 1.0000 | ORAL_TABLET | Freq: Two times a day (BID) | ORAL | 0 refills | Status: AC

## 2018-10-04 NOTE — ED Provider Notes (Signed)
MRN: [id number] DOB: 1970/09/13  Subjective:   [name] is a 48 y.o. female presenting for 1 week history of recurrent sinus congestion, postnasal drainage, stuffy nose, now having left-sided facial pain and swelling.  Patient reports to longstanding history of severe dental issues, is scheduled to have surgery in mid October.  She has multiple missing teeth.  Patient has been taking her Allegra without any relief from her facial pain and swelling.  Reports longstanding history of difficult sinus issues with weather changes.  No current facility-administered medications for this encounter.   Current Outpatient Medications:  .  ACETAMINOPHEN PO, Take by mouth., Disp: , Rfl:  .  azithromycin (ZITHROMAX) 250 MG tablet, Take 1 tablet (250 mg total) by mouth daily. Take first 2 tablets together, then 1 every day until finished., Disp: 6 tablet, Rfl: 0 .  ferrous sulfate 325 (65 FE) MG tablet, Take 325 mg by mouth 2 (two) times daily with a meal. , Disp: , Rfl:  .  fluticasone (FLONASE) 50 MCG/ACT nasal spray, Place 1-2 sprays into both nostrils daily., Disp: 1 g, Rfl: 0 .  GABAPENTIN PO, Take by mouth., Disp: , Rfl:  .  metroNIDAZOLE (FLAGYL) 500 MG tablet, Take 1 tablet (500 mg total) by mouth 2 (two) times daily., Disp: 14 tablet, Rfl: 0 .  traMADol (ULTRAM) 50 MG tablet, Take 1 tablet (50 mg total) by mouth every 6 (six) hours as needed., Disp: 15 tablet, Rfl: 0   Allergies  Allergen Reactions  . Morphine And Related Hives and Shortness Of Breath  . Clindamycin/Lincomycin Diarrhea and Nausea And Vomiting  . Hydrocodone Nausea And Vomiting    Past Medical History:  Diagnosis Date  . Anemia    resolved  . Bilateral ovarian cysts   . Lymphedema   . Migraine      Past Surgical History:  Procedure Laterality Date  . CESAREAN SECTION    . TUBAL LIGATION    . uterine ablation      ROS  Objective:   Vitals: BP 118/60 (BP Location: Right Arm)   Pulse 78   Temp 98.6 F (37  C) (Oral)   Resp 18   SpO2 100%   Physical Exam Constitutional:      General: She is not in acute distress.    Appearance: Normal appearance. She is well-developed. She is not ill-appearing.  HENT:     Head: Normocephalic and atraumatic.      Right Ear: Tympanic membrane and ear canal normal. No drainage or tenderness. No middle ear effusion. Tympanic membrane is not erythematous.     Left Ear: Tympanic membrane and ear canal normal. No drainage or tenderness.  No middle ear effusion. Tympanic membrane is not erythematous.     Nose: Congestion and rhinorrhea present.     Mouth/Throat:     Mouth: Mucous membranes are moist. No oral lesions.     Dentition: Abnormal dentition (Left upper molars missing but evidence of gingival erythema and swelling). Dental caries present.     Pharynx: Oropharynx is clear. No pharyngeal swelling, oropharyngeal exudate, posterior oropharyngeal erythema or uvula swelling.     Tonsils: No tonsillar exudate or tonsillar abscesses.  Eyes:     General: No scleral icterus.    Extraocular Movements: Extraocular movements intact.     Right eye: Normal extraocular motion.     Left eye: Normal extraocular motion.     Conjunctiva/sclera: Conjunctivae normal.     Pupils: Pupils are equal, round, and reactive to light.  Neck:     Musculoskeletal: Normal range of motion and neck supple.  Cardiovascular:     Rate and Rhythm: Normal rate.  Pulmonary:     Effort: Pulmonary effort is normal.  Lymphadenopathy:     Cervical: No cervical adenopathy.  Skin:    General: Skin is warm and dry.  Neurological:     General: No focal deficit present.     Mental Status: She is alert and oriented to person, place, and time.  Psychiatric:        Mood and Affect: Mood normal.        Behavior: Behavior normal.      Assessment and Plan :   1. Dental infection   2. Facial swelling   3. Facial pain   4. Allergic rhinitis due to other allergic trigger, unspecified  seasonality     Start Augmentin for dental abscess, prednisone for swelling, inflammation.  Counseled on need for using an antihistamine consistently.  Prednisone will also help with current allergic rhinitis.  Patient is to maintain her appointment with her dental surgeon.  Emphasized need for dental surgeon consult.  Counseled patient on potential for adverse effects with medications prescribed/recommended today, strict ER and return-to-clinic precautions discussed, patient verbalized understanding.    Wallis Bamberg, [place] 10/04/18 1533

## 2018-10-04 NOTE — ED Triage Notes (Signed)
Pt presents with recurrent sinus issue; pt states she has had sinus drainage, facial pain & swelling, and congestion for over a week with no relief with OTC medication or nasal spray.

## 2020-05-08 ENCOUNTER — Other Ambulatory Visit: Payer: Self-pay | Admitting: Nurse Practitioner

## 2020-05-08 ENCOUNTER — Other Ambulatory Visit (HOSPITAL_COMMUNITY): Payer: Self-pay | Admitting: Nurse Practitioner

## 2020-05-08 DIAGNOSIS — I890 Lymphedema, not elsewhere classified: Secondary | ICD-10-CM

## 2023-05-13 ENCOUNTER — Encounter (HOSPITAL_BASED_OUTPATIENT_CLINIC_OR_DEPARTMENT_OTHER): Payer: Self-pay

## 2023-05-13 ENCOUNTER — Emergency Department (HOSPITAL_BASED_OUTPATIENT_CLINIC_OR_DEPARTMENT_OTHER): Admission: EM | Admit: 2023-05-13 | Discharge: 2023-05-13 | Disposition: A | Discharge status: Home / Self Care

## 2023-05-13 DIAGNOSIS — B3789 Other sites of candidiasis: Secondary | ICD-10-CM

## 2023-05-13 DIAGNOSIS — L03317 Cellulitis of buttock: Secondary | ICD-10-CM

## 2023-05-13 DIAGNOSIS — R21 Rash and other nonspecific skin eruption: Secondary | ICD-10-CM | POA: Insufficient documentation

## 2023-05-13 MED ORDER — CEPHALEXIN 500 MG PO CAPS: 500.0000 mg | ORAL_CAPSULE | Freq: Four times a day (QID) | ORAL | 0 refills | Status: AC

## 2023-05-13 MED ORDER — NYSTATIN 100000 UNIT/GM EX CREA: TOPICAL_CREAM | CUTANEOUS | 0 refills | Status: AC

## 2023-05-13 NOTE — ED Provider Notes (Signed)
 [place] [facility] AT [facility] Provider Note   CSN: [id number] Arrival date & time: 05/13/23  2130     History  Chief Complaint  Patient presents with   Abscess    [name] is a 53 y.o. female.  53 year old female presenting emergency department for possible abscess/rash.  Reports that she think she had an abscess last week just above sacrum, began draining and felt better.  She took some leftover doxycycline  that she had.  However the past couple days she has had irritation down into the cleft of her buttocks that she states is raw.  No fevers, no chills.  No nausea or vomiting.   Abscess      Home Medications Prior to Admission medications   Medication Sig Start Date End Date Taking? Authorizing Provider  ACETAMINOPHEN  PO Take by mouth.    [provider]  amoxicillin -clavulanate (AUGMENTIN ) 875-125 MG tablet Take 1 tablet by mouth every 12 (twelve) hours. 10/04/18   Adolph Hoop, PA-C  azithromycin  (ZITHROMAX ) 250 MG tablet Take 1 tablet (250 mg total) by mouth daily. Take first 2 tablets together, then 1 every day until finished. 04/05/18   Long, Joshua G, MD  ferrous sulfate 325 (65 FE) MG tablet Take 325 mg by mouth 2 (two) times daily with a meal.     [provider]  fluticasone  (FLONASE ) 50 MCG/ACT nasal spray Place 1-2 sprays into both nostrils daily. 06/26/18   Wieters, Hallie C, PA-C  GABAPENTIN PO Take by mouth.    [provider]  metroNIDAZOLE  (FLAGYL ) 500 MG tablet Take 1 tablet (500 mg total) by mouth 2 (two) times daily. 07/23/16   Calista Catching, FNP  predniSONE  (DELTASONE ) 20 MG tablet Take 2 tablets daily with breakfast. 10/04/18   Adolph Hoop, PA-C  traMADol  (ULTRAM ) 50 MG tablet Take 1 tablet (50 mg total) by mouth every 6 (six) hours as needed. 07/23/16   Calista Catching, FNP      Allergies    Morphine and codeine, Clindamycin /lincomycin, and Hydrocodone    Review of Systems   Review of  Systems  Physical Exam Updated Vital Signs BP 127/77   Pulse 80   Temp 98 F (36.7 C) (Oral)   Resp 18   Ht 5\' 10"  (1.778 m)   Wt 76.2 kg   SpO2 100%   BMI 24.11 kg/m  Physical Exam Vitals and nursing note reviewed.  HENT:     Head: Normocephalic.     Mouth/Throat:     Mouth: Mucous membranes are moist.  Eyes:     Conjunctiva/sclera: Conjunctivae normal.  Cardiovascular:     Rate and Rhythm: Normal rate.  Pulmonary:     Effort: Pulmonary effort is normal.  Abdominal:     General: Abdomen is flat.  Musculoskeletal:        General: Normal range of motion.  Skin:    General: Skin is warm.     Capillary Refill: Capillary refill takes less than 2 seconds.  Neurological:     Mental Status: She is alert and oriented to person, place, and time.  Psychiatric:        Mood and Affect: Mood normal.        Behavior: Behavior normal.     ED Results / Procedures / Treatments   Labs (all labs ordered are listed, but only abnormal results are displayed) Labs Reviewed - No data to display  EKG None  Radiology No results found.  Procedures Procedures  Medications Ordered in ED Medications - No data to display  ED Course/ Medical Decision Making/ A&P Clinical Course as of 05/13/23 0802  Fri May 13, 2023  7829 Seen in the ED in past for dental abscess per chart review.  [TY]    Clinical Course User Index [TY] Rolinda Climes, DO                                 Medical Decision Making This is a 53 year old female presenting the emergency department for rash, possible abscess.  Afebrile nontachycardic, normotensive.  She has what appears to be a candidal type rash in between buttocks.  There is a small area of erythema an induration at the top of her right buttock cleft/sacrum but no fluctuance.  Exam not consistent with Fournier's gangrene.  Overall nontoxic-appearing.  Will discharge with antibiotics and topical antifungal.  Patient to follow-up primary doctor.   Return precautions given.          Final Clinical Impression(s) / ED Diagnoses Final diagnoses:  None    Rx / DC Orders ED Discharge Orders     None         Rolinda Climes, DO 05/13/23 0802

## 2023-05-13 NOTE — Discharge Instructions (Signed)
 We are prescribing antibiotics.  Please take them as prescribed for the full course even if symptoms improve.  You may use the ointment until symptoms improve and may discontinue.  You may take over-the-counter Tylenol  alternating with ibuprofen  for further pain relief with dosages as directed on the packaging.  Return immediately if develop fevers, chills, severe pain, worsening redness to groin area, nausea, vomiting or any new or worsening symptoms that are concerning to you.

## 2023-05-13 NOTE — ED Notes (Signed)
 ED Provider at bedside.

## 2023-05-13 NOTE — ED Notes (Signed)
 Assisted doctor with buttocks exam.

## 2023-05-13 NOTE — ED Triage Notes (Signed)
 Pt reports that she has had an abscess on her buttocks. States that the abscess popped last week and now her buttocks is raw feeling.

## 2023-05-17 ENCOUNTER — Emergency Department (HOSPITAL_BASED_OUTPATIENT_CLINIC_OR_DEPARTMENT_OTHER)
Admission: EM | Admit: 2023-05-17 | Discharge: 2023-05-17 | Disposition: A | Discharge status: Home / Self Care | Attending: Emergency Medicine | Admitting: Emergency Medicine

## 2023-05-17 ENCOUNTER — Encounter (HOSPITAL_BASED_OUTPATIENT_CLINIC_OR_DEPARTMENT_OTHER): Payer: Self-pay | Admitting: Urology

## 2023-05-17 ENCOUNTER — Emergency Department (HOSPITAL_BASED_OUTPATIENT_CLINIC_OR_DEPARTMENT_OTHER)

## 2023-05-17 DIAGNOSIS — Y9226 Movie house or cinema as the place of occurrence of the external cause: Secondary | ICD-10-CM | POA: Insufficient documentation

## 2023-05-17 DIAGNOSIS — X509XXA Other and unspecified overexertion or strenuous movements or postures, initial encounter: Secondary | ICD-10-CM | POA: Diagnosis not present

## 2023-05-17 DIAGNOSIS — T148XXA Other injury of unspecified body region, initial encounter: Secondary | ICD-10-CM

## 2023-05-17 DIAGNOSIS — M25561 Pain in right knee: Secondary | ICD-10-CM

## 2023-05-17 DIAGNOSIS — S8991XA Unspecified injury of right lower leg, initial encounter: Secondary | ICD-10-CM | POA: Insufficient documentation

## 2023-05-17 MED ORDER — IBUPROFEN 800 MG PO TABS
800.0000 mg | ORAL_TABLET | Freq: Once | ORAL | Status: AC
  Administered 2023-05-17: 800 mg via ORAL
  Filled 2023-05-17: qty 1

## 2023-05-17 NOTE — ED Notes (Signed)
 Patient transported to X-ray

## 2023-05-17 NOTE — ED Notes (Signed)
 Pt refused facility provided crutches at this time.  Immobilzer in place

## 2023-05-17 NOTE — ED Notes (Signed)
 ED Provider at bedside.

## 2023-05-17 NOTE — ED Triage Notes (Signed)
 Pt states right knee "popped out of joint" yesterday  States pain with weight bearing   Swelling noted

## 2023-05-17 NOTE — Discharge Instructions (Signed)
 Placed in a knee immobilizer and provided crutches for comfort, take NSAIDs for pain control, ice the extremity, elevate the extremity, rest, follow-up with sports medicine in clinic in 1 week's time for repeat assessment.  Your symptoms could be due to patellar dislocation and subsequent relocation versus ligamentous injury in the knee.

## 2023-05-17 NOTE — ED Provider Notes (Addendum)
 [place] [facility] AT [facility] Provider Note   CSN: [id number] Arrival date & time: 05/17/23  6213     History  Chief Complaint  Patient presents with   Knee Injury    [name] is a 53 y.o. female.  HPI   53 year old female presenting to the emergency department after knee injury.  The patient states that she was filming a TikTok video yesterday when her right knee "popped out of the joint" yesterday.  She has had pain with weightbearing ever since.  She states that she went to the ground and felt her patella popped back in when her leg straightened out.  She has had some swelling to the knee.  She also states that she is having decreased sensation to the plantar aspect of the right foot.  She continues to endorse pain and swelling, has not taken any medication this morning.  No other injuries or complaints.  Home Medications Prior to Admission medications   Medication Sig Start Date End Date Taking? Authorizing Provider  ACETAMINOPHEN  PO Take by mouth.    [provider]  amoxicillin -clavulanate (AUGMENTIN ) 875-125 MG tablet Take 1 tablet by mouth every 12 (twelve) hours. 10/04/18   Adolph Hoop, PA-C  azithromycin  (ZITHROMAX ) 250 MG tablet Take 1 tablet (250 mg total) by mouth daily. Take first 2 tablets together, then 1 every day until finished. 04/05/18   Long, Shereen Dike, MD  cephALEXin  (KEFLEX ) 500 MG capsule Take 1 capsule (500 mg total) by mouth 4 (four) times daily. 05/13/23   Rolinda Climes, DO  ferrous sulfate 325 (65 FE) MG tablet Take 325 mg by mouth 2 (two) times daily with a meal.     [provider]  fluticasone  (FLONASE ) 50 MCG/ACT nasal spray Place 1-2 sprays into both nostrils daily. 06/26/18   Wieters, Hallie C, PA-C  GABAPENTIN PO Take by mouth.    [provider]  metroNIDAZOLE  (FLAGYL ) 500 MG tablet Take 1 tablet (500 mg total) by mouth 2 (two) times daily. 07/23/16   Calista Catching, FNP  nystatin cream  (MYCOSTATIN) Apply to affected area 2 times daily 05/13/23   Rolinda Climes, DO  predniSONE  (DELTASONE ) 20 MG tablet Take 2 tablets daily with breakfast. 10/04/18   Adolph Hoop, PA-C  traMADol  (ULTRAM ) 50 MG tablet Take 1 tablet (50 mg total) by mouth every 6 (six) hours as needed. 07/23/16   Calista Catching, FNP      Allergies    Morphine and codeine, Clindamycin /lincomycin, and Hydrocodone    Review of Systems   Review of Systems  All other systems reviewed and are negative.   Physical Exam Updated Vital Signs BP (!) 136/96   Pulse 79   Temp 97.7 F (36.5 C) (Oral)   Resp 20   Ht 5\' 10"  (1.778 m)   Wt 76.2 kg   SpO2 100%   BMI 24.10 kg/m  Physical Exam Vitals and nursing note reviewed.  Constitutional:      General: She is not in acute distress. HENT:     Head: Normocephalic and atraumatic.  Eyes:     Conjunctiva/sclera: Conjunctivae normal.     Pupils: Pupils are equal, round, and reactive to light.  Cardiovascular:     Rate and Rhythm: Normal rate and regular rhythm.  Pulmonary:     Effort: Pulmonary effort is normal. No respiratory distress.  Abdominal:     General: There is no distension.     Tenderness: There is no  guarding.  Musculoskeletal:        General: No swelling, tenderness, deformity or signs of injury.     Cervical back: Neck supple.     Comments: Right Knee- Patella is located, some pain with range of motion of the knee, knee itself is stable, nontender, no palpable effusion, distally neurovascularly intact with the exception of decreased sensation along the plantar aspect of the right foot  Skin:    Findings: No lesion or rash.  Neurological:     General: No focal deficit present.     Mental Status: She is alert. Mental status is at baseline.     ED Results / Procedures / Treatments   Labs (all labs ordered are listed, but only abnormal results are displayed) Labs Reviewed - No data to display  EKG None  Radiology DG Knee Complete 4  Views Right Result Date: 05/17/2023 CLINICAL DATA:  Right knee injury.  Felt a pop out yesterday. EXAM: RIGHT KNEE - COMPLETE 4 VIEW COMPARISON:  X-ray 07/19/2012. FINDINGS: No acute fracture or dislocation. Preserved joint spaces and bone mineralization. Minimal osteophytes of patellar femoral joint. Small osteophytes as well of the mediolateral compartments. No joint effusion. There is a well corticated density once again seen medial to the medial femoral condyle. Please correlate for sequela of old injury including Pellegrini-Stieda. IMPRESSION: Minimal degenerative change.  Chronic changes. Electronically Signed   By: Adrianna Horde M.D.   On: 05/17/2023 10:02    Procedures Procedures    Medications Ordered in ED Medications  ibuprofen  (ADVIL ) tablet 800 mg (800 mg Oral Given 05/17/23 1308)    ED Course/ Medical Decision Making/ A&P                                 Medical Decision Making Amount and/or Complexity of Data Reviewed Radiology: ordered.  Risk Prescription drug management.     53 year old female presenting to the emergency department after knee injury.  The patient states that she was filming a TikTok video yesterday when her right knee "popped out of the joint" yesterday.  She has had pain with weightbearing ever since.  She states that she went to the ground and felt her patella popped back in when her leg straightened out.  She has had some swelling to the knee.  She also states that she is having decreased sensation to the plantar aspect of the right foot.  She continues to endorse pain and swelling, has not taken any medication this morning.  No other injuries or complaints.  On arrival, the patient was vitally stable.  Physical exam: Right Knee- Patella is located, some pain with range of motion of the knee, knee itself is stable, nontender, no palpable effusion, distally neurovascularly intact with the exception of decreased sensation along the plantar aspect of the  right foot  XR Right Knee: IMPRESSION:  Minimal degenerative change.  Chronic changes.     Suspect likely patellar dislocation versus ligamentous injury.  Suspect likely injury of the tibial nerve in the setting of his.  Explained to patient this could be temporary from stretch injury or potentially permanent.  Patient advised to follow-up for recheck with sports medicine.  Given the patient's pain with weightbearing, the patient will be placed in a knee immobilizer, crutches were provided, advise NSAIDs for pain control and the plan will be for outpatient follow-up with sports medicine in 1 week for recheck.  Advise RICE, return  precautions provided.     Final Clinical Impression(s) / ED Diagnoses Final diagnoses:  Acute pain of right knee  Nerve injury    Rx / DC Orders ED Discharge Orders          Ordered    AMB referral to sports medicine        05/17/23 1013              Rosealee Concha, MD 05/17/23 1014    Rosealee Concha, MD 05/17/23 1026

## 2023-11-17 ENCOUNTER — Encounter: Admitting: Obstetrics & Gynecology
